# Patient Record
Sex: Male | Born: 1935 | Race: White | Hispanic: No | Marital: Married | Smoking: Former smoker
Health system: Southern US, Community
[De-identification: ages and names within clinical notes are randomized; demographics above are authoritative.]

## PROBLEM LIST (undated history)

## (undated) DIAGNOSIS — K222 Esophageal obstruction: Secondary | ICD-10-CM

## (undated) DIAGNOSIS — I739 Peripheral vascular disease, unspecified: Secondary | ICD-10-CM

## (undated) DIAGNOSIS — I10 Essential (primary) hypertension: Secondary | ICD-10-CM

## (undated) DIAGNOSIS — M1380 Other specified arthritis, unspecified site: Secondary | ICD-10-CM

## (undated) DIAGNOSIS — G4733 Obstructive sleep apnea (adult) (pediatric): Secondary | ICD-10-CM

## (undated) DIAGNOSIS — Z955 Presence of coronary angioplasty implant and graft: Secondary | ICD-10-CM

## (undated) DIAGNOSIS — K760 Fatty (change of) liver, not elsewhere classified: Secondary | ICD-10-CM

## (undated) DIAGNOSIS — E785 Hyperlipidemia, unspecified: Secondary | ICD-10-CM

## (undated) DIAGNOSIS — I2510 Atherosclerotic heart disease of native coronary artery without angina pectoris: Secondary | ICD-10-CM

## (undated) HISTORY — PX: COLONOSCOPY: SHX174

## (undated) HISTORY — PX: CARDIAC CATHETERIZATION: SHX172

---

## 2007-03-19 ENCOUNTER — Ambulatory Visit: Payer: Self-pay | Admitting: Unknown Physician Specialty

## 2010-01-20 ENCOUNTER — Ambulatory Visit: Payer: Self-pay | Admitting: Ophthalmology

## 2012-01-06 ENCOUNTER — Other Ambulatory Visit: Payer: Self-pay | Admitting: Neurosurgery

## 2012-01-06 DIAGNOSIS — S32009A Unspecified fracture of unspecified lumbar vertebra, initial encounter for closed fracture: Secondary | ICD-10-CM

## 2012-01-06 DIAGNOSIS — M545 Low back pain: Secondary | ICD-10-CM

## 2012-01-09 ENCOUNTER — Other Ambulatory Visit: Payer: Self-pay | Admitting: Neurosurgery

## 2012-01-09 DIAGNOSIS — M545 Low back pain: Secondary | ICD-10-CM

## 2012-01-09 DIAGNOSIS — S32009A Unspecified fracture of unspecified lumbar vertebra, initial encounter for closed fracture: Secondary | ICD-10-CM

## 2012-01-11 ENCOUNTER — Other Ambulatory Visit: Payer: Self-pay

## 2012-01-12 ENCOUNTER — Ambulatory Visit
Admission: RE | Admit: 2012-01-12 | Discharge: 2012-01-12 | Disposition: A | Discharge status: Home / Self Care | Payer: PRIVATE HEALTH INSURANCE | Source: Ambulatory Visit | Attending: Neurosurgery | Admitting: Neurosurgery

## 2012-01-12 DIAGNOSIS — M545 Low back pain: Secondary | ICD-10-CM

## 2012-01-12 DIAGNOSIS — S32009A Unspecified fracture of unspecified lumbar vertebra, initial encounter for closed fracture: Secondary | ICD-10-CM

## 2012-03-14 ENCOUNTER — Ambulatory Visit: Payer: Self-pay | Admitting: Unknown Physician Specialty

## 2012-10-09 ENCOUNTER — Ambulatory Visit: Payer: Self-pay | Admitting: Unknown Physician Specialty

## 2012-10-10 LAB — PATHOLOGY REPORT

## 2014-12-16 ENCOUNTER — Emergency Department
Admit: 2014-12-16 | Disposition: A | Discharge status: Home / Self Care | Payer: Self-pay | Admitting: Emergency Medicine

## 2014-12-16 LAB — TROPONIN I: Troponin-I: <0.03 ng/mL

## 2014-12-16 LAB — BASIC METABOLIC PANEL
Anion Gap: 7 (ref 7–16)
BUN: 20 mg/dL
CALCIUM: 8.8 mg/dL — AB
CHLORIDE: 104 mmol/L
CREATININE: 1.03 mg/dL
Co2: 25 mmol/L
EGFR (African American): >60
EGFR (Non-African Amer.): >60
GLUCOSE: 101 mg/dL — AB
POTASSIUM: 4.3 mmol/L
SODIUM: 136 mmol/L

## 2014-12-16 LAB — CBC
HCT: 43.4 % (ref 40.0–52.0)
HGB: 14.5 g/dL (ref 13.0–18.0)
MCH: 31.7 pg (ref 26.0–34.0)
MCHC: 33.3 g/dL (ref 32.0–36.0)
MCV: 95 fL (ref 80–100)
Platelet: 237 10*3/uL (ref 150–440)
RBC: 4.57 10*6/uL (ref 4.40–5.90)
RDW: 13.2 % (ref 11.5–14.5)
WBC: 6.9 10*3/uL (ref 3.8–10.6)

## 2014-12-19 ENCOUNTER — Ambulatory Visit
Admit: 2014-12-19 | Disposition: A | Discharge status: Home / Self Care | Payer: Self-pay | Admitting: Obstetrics and Gynecology

## 2014-12-19 ENCOUNTER — Ambulatory Visit
Admit: 2014-12-19 | Disposition: A | Discharge status: Home / Self Care | Payer: Self-pay | Attending: Internal Medicine | Admitting: Internal Medicine

## 2014-12-25 ENCOUNTER — Other Ambulatory Visit (HOSPITAL_COMMUNITY): Payer: Self-pay | Admitting: Internal Medicine

## 2014-12-25 DIAGNOSIS — R079 Chest pain, unspecified: Secondary | ICD-10-CM

## 2015-01-07 ENCOUNTER — Ambulatory Visit (HOSPITAL_COMMUNITY): Payer: Medicare Other

## 2015-01-08 ENCOUNTER — Ambulatory Visit
Admit: 2015-01-08 | Disposition: A | Discharge status: Other Acute Inpatient Hospital | Payer: Self-pay | Attending: Internal Medicine | Admitting: Internal Medicine

## 2015-01-08 LAB — BASIC METABOLIC PANEL
Anion Gap: 5 — ABNORMAL LOW (ref 7–16)
BUN: 22 mg/dL — ABNORMAL HIGH
CALCIUM: 8.3 mg/dL — AB
CHLORIDE: 108 mmol/L
CREATININE: 1.03 mg/dL
Co2: 24 mmol/L
EGFR (African American): >60
Glucose: 140 mg/dL — ABNORMAL HIGH
POTASSIUM: 4.1 mmol/L
Sodium: 137 mmol/L

## 2015-01-08 LAB — CBC WITH DIFFERENTIAL/PLATELET
BASOS ABS: 0.1 10*3/uL (ref 0.0–0.1)
Basophil %: 0.5 %
EOS ABS: 0 10*3/uL (ref 0.0–0.7)
Eosinophil %: 0.2 %
HCT: 40.7 % (ref 40.0–52.0)
HGB: 13.4 g/dL (ref 13.0–18.0)
LYMPHS ABS: 1 10*3/uL (ref 1.0–3.6)
Lymphocyte %: 7.3 %
MCH: 31.6 pg (ref 26.0–34.0)
MCHC: 32.9 g/dL (ref 32.0–36.0)
MCV: 96 fL (ref 80–100)
MONOS PCT: 5.4 %
Monocyte #: 0.7 x10 3/mm (ref 0.2–1.0)
Neutrophil #: 11.7 10*3/uL — ABNORMAL HIGH (ref 1.4–6.5)
Neutrophil %: 86.6 %
PLATELETS: 262 10*3/uL (ref 150–440)
RBC: 4.23 10*6/uL — ABNORMAL LOW (ref 4.40–5.90)
RDW: 13.2 % (ref 11.5–14.5)
WBC: 13.5 10*3/uL — AB (ref 3.8–10.6)

## 2015-01-08 LAB — CK TOTAL AND CKMB (NOT AT ARMC)
CK, TOTAL: 81 U/L
CK-MB: 1.8 ng/mL

## 2015-01-09 ENCOUNTER — Inpatient Hospital Stay (HOSPITAL_COMMUNITY): Payer: Medicare Other

## 2015-01-09 ENCOUNTER — Encounter (HOSPITAL_COMMUNITY): Payer: Self-pay | Admitting: Internal Medicine

## 2015-01-09 ENCOUNTER — Inpatient Hospital Stay (HOSPITAL_COMMUNITY)
Admission: AD | Admit: 2015-01-09 | Discharge: 2015-01-14 | DRG: 065 | Disposition: A | Discharge status: Rehabilitation Facility | Payer: Medicare Other | Source: Other Acute Inpatient Hospital | Attending: Internal Medicine | Admitting: Internal Medicine

## 2015-01-09 DIAGNOSIS — K222 Esophageal obstruction: Secondary | ICD-10-CM | POA: Diagnosis present

## 2015-01-09 DIAGNOSIS — G4733 Obstructive sleep apnea (adult) (pediatric): Secondary | ICD-10-CM | POA: Diagnosis present

## 2015-01-09 DIAGNOSIS — Z86718 Personal history of other venous thrombosis and embolism: Secondary | ICD-10-CM | POA: Diagnosis not present

## 2015-01-09 DIAGNOSIS — R21 Rash and other nonspecific skin eruption: Secondary | ICD-10-CM | POA: Diagnosis present

## 2015-01-09 DIAGNOSIS — Z79899 Other long term (current) drug therapy: Secondary | ICD-10-CM | POA: Diagnosis not present

## 2015-01-09 DIAGNOSIS — M064 Inflammatory polyarthropathy: Secondary | ICD-10-CM | POA: Diagnosis present

## 2015-01-09 DIAGNOSIS — Z4659 Encounter for fitting and adjustment of other gastrointestinal appliance and device: Secondary | ICD-10-CM | POA: Insufficient documentation

## 2015-01-09 DIAGNOSIS — E785 Hyperlipidemia, unspecified: Secondary | ICD-10-CM | POA: Diagnosis present

## 2015-01-09 DIAGNOSIS — I6310 Cerebral infarction due to embolism of unspecified precerebral artery: Secondary | ICD-10-CM | POA: Diagnosis not present

## 2015-01-09 DIAGNOSIS — R471 Dysarthria and anarthria: Secondary | ICD-10-CM | POA: Diagnosis present

## 2015-01-09 DIAGNOSIS — N400 Enlarged prostate without lower urinary tract symptoms: Secondary | ICD-10-CM | POA: Diagnosis present

## 2015-01-09 DIAGNOSIS — I69391 Dysphagia following cerebral infarction: Secondary | ICD-10-CM | POA: Insufficient documentation

## 2015-01-09 DIAGNOSIS — I10 Essential (primary) hypertension: Secondary | ICD-10-CM | POA: Diagnosis present

## 2015-01-09 DIAGNOSIS — I2510 Atherosclerotic heart disease of native coronary artery without angina pectoris: Secondary | ICD-10-CM

## 2015-01-09 DIAGNOSIS — Z823 Family history of stroke: Secondary | ICD-10-CM | POA: Diagnosis not present

## 2015-01-09 DIAGNOSIS — Z87891 Personal history of nicotine dependence: Secondary | ICD-10-CM

## 2015-01-09 DIAGNOSIS — Z7902 Long term (current) use of antithrombotics/antiplatelets: Secondary | ICD-10-CM | POA: Diagnosis not present

## 2015-01-09 DIAGNOSIS — Z7982 Long term (current) use of aspirin: Secondary | ICD-10-CM

## 2015-01-09 DIAGNOSIS — I69322 Dysarthria following cerebral infarction: Secondary | ICD-10-CM | POA: Diagnosis not present

## 2015-01-09 DIAGNOSIS — I82491 Acute embolism and thrombosis of other specified deep vein of right lower extremity: Secondary | ICD-10-CM | POA: Diagnosis not present

## 2015-01-09 DIAGNOSIS — G8191 Hemiplegia, unspecified affecting right dominant side: Secondary | ICD-10-CM | POA: Diagnosis present

## 2015-01-09 DIAGNOSIS — R1310 Dysphagia, unspecified: Secondary | ICD-10-CM | POA: Diagnosis present

## 2015-01-09 DIAGNOSIS — R4701 Aphasia: Secondary | ICD-10-CM | POA: Diagnosis present

## 2015-01-09 DIAGNOSIS — I6501 Occlusion and stenosis of right vertebral artery: Secondary | ICD-10-CM

## 2015-01-09 DIAGNOSIS — Z955 Presence of coronary angioplasty implant and graft: Secondary | ICD-10-CM | POA: Diagnosis not present

## 2015-01-09 DIAGNOSIS — I6340 Cerebral infarction due to embolism of unspecified cerebral artery: Secondary | ICD-10-CM | POA: Diagnosis not present

## 2015-01-09 DIAGNOSIS — I639 Cerebral infarction, unspecified: Secondary | ICD-10-CM

## 2015-01-09 DIAGNOSIS — I69359 Hemiplegia and hemiparesis following cerebral infarction affecting unspecified side: Secondary | ICD-10-CM | POA: Diagnosis present

## 2015-01-09 DIAGNOSIS — K760 Fatty (change of) liver, not elsewhere classified: Secondary | ICD-10-CM | POA: Diagnosis present

## 2015-01-09 DIAGNOSIS — R579 Shock, unspecified: Secondary | ICD-10-CM | POA: Diagnosis not present

## 2015-01-09 DIAGNOSIS — R270 Ataxia, unspecified: Secondary | ICD-10-CM | POA: Diagnosis present

## 2015-01-09 DIAGNOSIS — I6789 Other cerebrovascular disease: Secondary | ICD-10-CM | POA: Diagnosis not present

## 2015-01-09 DIAGNOSIS — I63012 Cerebral infarction due to thrombosis of left vertebral artery: Secondary | ICD-10-CM | POA: Diagnosis not present

## 2015-01-09 DIAGNOSIS — I97610 Postprocedural hemorrhage and hematoma of a circulatory system organ or structure following a cardiac catheterization: Secondary | ICD-10-CM | POA: Diagnosis not present

## 2015-01-09 DIAGNOSIS — I6319 Cerebral infarction due to embolism of other precerebral artery: Secondary | ICD-10-CM | POA: Diagnosis not present

## 2015-01-09 HISTORY — DX: Esophageal obstruction: K22.2

## 2015-01-09 HISTORY — DX: Hyperlipidemia, unspecified: E78.5

## 2015-01-09 HISTORY — DX: Presence of coronary angioplasty implant and graft: Z95.5

## 2015-01-09 HISTORY — DX: Essential (primary) hypertension: I10

## 2015-01-09 HISTORY — DX: Obstructive sleep apnea (adult) (pediatric): G47.33

## 2015-01-09 HISTORY — DX: Atherosclerotic heart disease of native coronary artery without angina pectoris: I25.10

## 2015-01-09 HISTORY — DX: Other specified arthritis, unspecified site: M13.80

## 2015-01-09 HISTORY — DX: Fatty (change of) liver, not elsewhere classified: K76.0

## 2015-01-09 LAB — BASIC METABOLIC PANEL
ANION GAP: 6 — AB (ref 7–16)
BUN: 22 mg/dL — ABNORMAL HIGH
CREATININE: 0.96 mg/dL
Calcium, Total: 8.5 mg/dL — ABNORMAL LOW
Chloride: 112 mmol/L — ABNORMAL HIGH
Co2: 24 mmol/L
Glucose: 107 mg/dL — ABNORMAL HIGH
Potassium: 4 mmol/L
Sodium: 142 mmol/L

## 2015-01-09 LAB — CBC WITH DIFFERENTIAL/PLATELET
Basophils Absolute: 0 10*3/uL (ref 0.0–0.1)
Basophils Relative: 0 % (ref 0–1)
Eosinophils Absolute: 0.1 10*3/uL (ref 0.0–0.7)
Eosinophils Relative: 1 % (ref 0–5)
HEMATOCRIT: 33 % — AB (ref 39.0–52.0)
Hemoglobin: 11.5 g/dL — ABNORMAL LOW (ref 13.0–17.0)
LYMPHS ABS: 1.6 10*3/uL (ref 0.7–4.0)
Lymphocytes Relative: 18 % (ref 12–46)
MCH: 32.4 pg (ref 26.0–34.0)
MCHC: 34.8 g/dL (ref 30.0–36.0)
MCV: 93 fL (ref 78.0–100.0)
MONOS PCT: 9 % (ref 3–12)
Monocytes Absolute: 0.8 10*3/uL (ref 0.1–1.0)
NEUTROS ABS: 6.1 10*3/uL (ref 1.7–7.7)
NEUTROS PCT: 72 % (ref 43–77)
Platelets: 209 10*3/uL (ref 150–400)
RBC: 3.55 MIL/uL — ABNORMAL LOW (ref 4.22–5.81)
RDW: 13.2 % (ref 11.5–15.5)
WBC: 8.5 10*3/uL (ref 4.0–10.5)

## 2015-01-09 MED ORDER — ASPIRIN 325 MG PO TABS
325.0000 mg | ORAL_TABLET | Freq: Every day | ORAL | Status: DC
  Administered 2015-01-10 – 2015-01-14 (×4): 325 mg via ORAL
  Filled 2015-01-09 (×4): qty 1

## 2015-01-09 MED ORDER — CLOPIDOGREL BISULFATE 75 MG PO TABS
75.0000 mg | ORAL_TABLET | Freq: Every day | ORAL | Status: DC
  Administered 2015-01-10 – 2015-01-14 (×6): 75 mg via NASOGASTRIC
  Filled 2015-01-09 (×6): qty 1

## 2015-01-09 MED ORDER — SALINE SPRAY 0.65 % NA SOLN
1.0000 | NASAL | Status: DC | PRN
  Filled 2015-01-09: qty 44

## 2015-01-09 MED ORDER — CLOPIDOGREL BISULFATE 75 MG PO TABS: 75.0000 mg | ORAL_TABLET | Freq: Every day | ORAL | Status: DC

## 2015-01-09 MED ORDER — ASPIRIN 300 MG RE SUPP
300.0000 mg | Freq: Every day | RECTAL | Status: DC
  Administered 2015-01-09 – 2015-01-12 (×2): 300 mg via RECTAL
  Filled 2015-01-09 (×2): qty 1

## 2015-01-09 MED ORDER — HEPARIN SODIUM (PORCINE) 5000 UNIT/ML IJ SOLN
5000.0000 [IU] | Freq: Three times a day (TID) | INTRAMUSCULAR | Status: DC
  Administered 2015-01-09 – 2015-01-14 (×15): 5000 [IU] via SUBCUTANEOUS
  Filled 2015-01-09 (×15): qty 1

## 2015-01-09 MED ORDER — STROKE: EARLY STAGES OF RECOVERY BOOK
Freq: Once | Status: AC
  Administered 2015-01-09: 1

## 2015-01-09 NOTE — Progress Notes (Signed)
RN informed that patient arrived before shift change. No current orders, will page MD for orders. Patient comfortable and family at bedside. Will continue to monitor closely. Monia PouchShakenna Quillan Whitter, RN

## 2015-01-09 NOTE — Consult Note (Signed)
Neurology Consultation Reason for Consult: Stroke Referring Physician:   CC: Speaking difficulty  History is obtained from:patient, medical record  HPI: Jerome Bowen is a 81 y.o. male who underwent elective cardiac cath for chest pain with abnormal strss and had a distal RCA stenosis and had a DES placed. Following the procedure, he was noted to be agitated. He was given further sedation with suspicion for possible adverse reaction to his initial sedation. He was noted to have difficulty speaking and as he was less agitated, it became clear that the aphasia persisted. An MRI was obtained which shows multifocal infarcts in both the anterior and posterior circulation consistent with cardiac embolus.    He also developed post-operative hematoma.   LKW: prior to MRI 4/28.  tpa given?: no,    ROS: A 14 point ROS was performed and is negative except as noted in the HPI.   PMH: CAD HTN H/o DVT Fatty liver Spinal stenosis bph seronegative Inflammatory arthritis esophageal stricture  Family History: Brother - stroke  Social History: Tob: denies  Exam: Current vital signs: BP 146/61 mmHg  Pulse 69  Temp(Src) 98.1 F (36.7 C) (Oral)  Resp 18  SpO2 98% Vital signs in last 24 hours: Temp:  [98.1 F (36.7 C)] 98.1 F (36.7 C) (04/29 1857) Pulse Rate:  [69] 69 (04/29 1857) Resp:  [18] 18 (04/29 1857) BP: (146)/(61) 146/61 mmHg (04/29 1857) SpO2:  [98 %] 98 % (04/29 1857) Weight:  [81.602 kg (179 lb 14.4 oz)] 81.602 kg (179 lb 14.4 oz) (04/29 1331)   Physical Exam  Constitutional: Appears well-developed and well-nourished.  Psych: Affect appropriate to situation Eyes: No scleral injection HENT: No OP obstrucion Head: Normocephalic.  Cardiovascular: Normal rate and regular rhythm.  Respiratory: Effort normal and breath sounds normal to anterior ascultation GI: Soft.  No distension. There is no tenderness.  Skin: WDI  Neuro: Mental Status: Patient is awake, alert,  awake, interactive and appropriate Patient is able to contribute details to the history.  No signs of neglect. Aphasia is difficult to assess given difficulty with writing due to ataxia and severe dysarthria. I suspect that he does not have any.  Cranial Nerves: II: Visual Fields are full. Pupils are equal, round, and reactive to light.   III,IV, VI: He is unable to cross midlien to the left, his right eye is abel to abduct but his left eye does not follow. He is able to look up and down with both eyes.  V: Facial sensation is symmetric to temperature VII: Facial movement is symmetric.  VIII: hearing is intact to voice Motor: Tone is normal. Bulk is normal. 5/5 strength was present on the left, he has 4/5 strength in the right arm and leg.  Sensory: Sensation is decreased to temp in the right arm, symmetric in the leg.  Deep Tendon Reflexes: 2+ and symmetric in the biceps and patellae.  Cerebellar: FNF and HKS are intact on the left, ataxic on the right.    I have reviewed labs in epic and the results pertinent to this consultation are: Cr(prior to procedure) 1.0 Recent LDL 109  I have reviewed the images obtained:MRI brain multifocal posterior circulation infarcts.   Impression: 81 yo M with multiple posterior circulation infarcts. I suspect that he had a single embolus that broke up and resulted in the shower. This is likely from the catheterization. I would image his posterior circulation, but if no clear source lesion is found, then I do not think further  workup would be needed and this would be considered a catheter related stroke.   Recommendations: 1) MRA brain and neck 2) He will likely be controlled for atherosclerotic risk factors for his cardiological reasons. 3) Can continue ASA + Plavix for DES from neuro perspective.  4) PT, OT, ST. Strict NPO pending ST eval\ 5) Stroke team to follow.     Ritta SlotMcNeill Lovena Kluck, MD [facility] [phone]  If 7pm- 7am,  please page neurology on call as listed in AMION.

## 2015-01-09 NOTE — H&P (Signed)
[facility] History and Physical  Patient: Jerome Bowen  MRN: [id number]  DOB: [birth year]  DOS: the patient was seen and examined on 01/09/2015 PCP: No PCP Per Patient  [facility] Chief Complaint: Stroke  HPI: Jerome Bowen is a 81 y.o. male with Past medical history of hypertension, dyslipidemia, coronary artery disease, obstructive sleep apnea on C Pap, seronegative arthritis. The patient was [facility]. The patient was recently hospitalized for elective angiography after an abnormal stress test. The patient was having progressively worsening chest pain prior to the stress test. The angiography showed that the patient had an RCA obstruction and patient underwent PCI with drug-eluting stent. During the procedure the patient become agitated and was acting confused. Initially it was thought that the patient had a reaction to Versed. Patient also developed a small hematoma at the groin. Patient was placed on tirofiban overnight per protocol. A CT of the head initially did not reveal any acute abnormality. Due to persistent aphasia and confusion and MRI was up pain which was positive for multifocal strokes. Carotid Doppler as well as echo program was obtained and the patient was sent here for further workup.  The patient is coming from hospital. And at his baseline independent for most of his ADL.  Review of Systems: as mentioned in the history of present illness.  A comprehensive review of the other systems is negative.  Past Medical History  Diagnosis Date  . Essential hypertension   . Dyslipidemia   . CAD (coronary artery disease)   . S/P right coronary artery (RCA) stent placement   . OSA (obstructive sleep apnea)   . Esophageal stricture   . Seronegative arthritis   . Fatty liver    No past surgical history on file. Social History:  reports that he has quit smoking. His smoking use included Cigarettes. He does not have any smokeless  tobacco history on file. He reports that he drinks alcohol. His drug history is not on file.  No Known Allergies  Family History  Problem Relation Age of Onset  . Heart disease Brother     Prior to Admission medications   Medication Sig Start Date End Date Taking? Authorizing Provider  aspirin EC 325 MG tablet Take 325 mg by mouth daily.   Yes Historical Provider, MD  cholecalciferol (VITAMIN D) 1000 UNITS tablet Take 1,000 Units by mouth daily.   Yes Historical Provider, MD  clopidogrel (PLAVIX) 75 MG tablet Take 75 mg by mouth daily.   Yes Historical Provider, MD  Cyanocobalamin 1000 MCG TBCR Take 1 tablet by mouth daily.   Yes Historical Provider, MD  losartan (COZAAR) 100 MG tablet Take 100 mg by mouth daily.   Yes Historical Provider, MD  tamsulosin (FLOMAX) 0.4 MG CAPS capsule Take 0.4 mg by mouth daily.    Yes Historical Provider, MD  vitamin E 1000 UNIT capsule Take 1,000 Units by mouth daily.   Yes Historical Provider, MD    Physical Exam: Filed Vitals:   01/09/15 1857  BP: 146/61  Pulse: 69  Temp: 98.1 F (36.7 C)  TempSrc: Oral  Resp: 18  SpO2: 98%    General: Alert, Awake and Oriented Appear in mild distress Eyes: PERRL ENT: Oral Mucosa clear dry Neck: no JVD Cardiovascular: S1 and S2 Present, no Murmur, Peripheral Pulses Present Respiratory: Bilateral Air entry equal and Decreased,  Clear to Auscultation, no Crackles, no wheezes Abdomen: Bowel Sound present, Soft and non tender Skin: no Rash Extremities:  no Pedal edema, no calf tenderness The groin site does not appear to have any hematoma has extensive erythema Neurologic: Bilateral equal strength in the lower extremity. 4 out of 5 in upper extremity. Expressive aphasia.  Labs on Admission:  CBC:  Recent Labs Lab 01/09/15 2304  WBC 8.5  NEUTROABS 6.1  HGB 11.5*  HCT 33.0*  MCV 93.0  PLT 209    CMP     Component Value Date/Time   NA 142 01/09/2015 2304   K 3.8 01/09/2015 2304   CL 110  01/09/2015 2304   CO2 23 01/09/2015 2304   GLUCOSE 101* 01/09/2015 2304   BUN 19 01/09/2015 2304   CREATININE 0.96 01/09/2015 2304   CALCIUM 8.5 01/09/2015 2304   PROT 6.0 01/09/2015 2304   ALBUMIN 3.0* 01/09/2015 2304   AST 21 01/09/2015 2304   ALT 20 01/09/2015 2304   ALKPHOS 45 01/09/2015 2304   BILITOT 0.8 01/09/2015 2304   GFRNONAA 77* 01/09/2015 2304   GFRAA 89* 01/09/2015 2304    Radiological Exams on Admission: Ct Abdomen Pelvis Wo Contrast  01/08/2015   CLINICAL DATA:  Status post cardiac catheterization. Swelling in the right lower quadrant. Possible hematoma.  EXAM: CT ABDOMEN AND PELVIS WITHOUT CONTRAST  TECHNIQUE: Multidetector CT imaging of the abdomen and pelvis was performed following the standard protocol without IV contrast.  COMPARISON:  None.  FINDINGS: The lung bases appear emphysematous but are clear. No pleural or pericardial effusion is identified.  Contrast material from the patient's cardiac catheterization is seen in the kidneys and urinary bladder. Urinary bladder contrast creates streak artifact in the pelvis. Hemorrhage is seen in the right groin which is not confluent and measures approximately 5.7 cm AP x 8.7 cm transverse by at least 10 cm craniocaudal. Hematoma is also identified in the right pelvis measuring approximately 5.1 cm AP by 3.6 cm transverse by 7.2 cm craniocaudal.  The gallbladder, spleen, adrenal glands, pancreas and kidneys are unremarkable. A tiny low attenuating lesion in the left hepatic lobe anteriorly and near the dome is likely a cyst. The liver is otherwise unremarkable. No lymphadenopathy is seen. Scattered aortoiliac atherosclerosis without aneurysm is noted. The stomach small bowel and appendix appear normal.  There is a superior endplate compression fracture of L2 with vertebral body height loss of approximately 40%. The fracture appears remote but cannot be definitively characterized. No lytic or sclerotic bony lesion is seen.   IMPRESSION: The study is positive for a moderate sized hematoma in the right groin extending into the right pelvis as described above. No other acute abnormality is identified.  Aortoiliac atherosclerosis without aneurysm.  L2 superior endplate compression fracture appears remote but cannot be definitively characterized.   Electronically Signed   By: Drusilla Kanner M.D.   On: 01/08/2015 10:38   US Carotid Bilateral  01/09/2015   CLINICAL DATA:  History of stroke, hypertension, CAD, visual disturbance, hyperlipidemia.  EXAM: BILATERAL CAROTID DUPLEX ULTRASOUND  TECHNIQUE: Wallace Cullens scale imaging, color Doppler and duplex ultrasound were performed of bilateral carotid and vertebral arteries in the neck.  COMPARISON:  Head CT- 01/08/2015; 12/19/2014  FINDINGS: Criteria: Quantification of carotid stenosis is based on velocity parameters that correlate the residual internal carotid diameter with NASCET-based stenosis levels, using the diameter of the distal internal carotid lumen as the denominator for stenosis measurement.  The following velocity measurements were obtained:  RIGHT  ICA:  104/15 cm/sec  CCA:  133/18 cm/sec  SYSTOLIC ICA/CCA RATIO:  0.8  DIASTOLIC ICA/CCA RATIO:  0.9  ECA:  95 cm/sec  LEFT  ICA:  115/21 cm/sec  CCA:  134/17 cm/sec  SYSTOLIC ICA/CCA RATIO:  0.9  DIASTOLIC ICA/CCA RATIO:  1.3  ECA:  115 cm/sec  RIGHT CAROTID ARTERY: There is a minimal amount of eccentric mixed echogenic plaque within the right carotid bulb (image 12), extending to involve the origin and proximal aspects of the right internal carotid artery (image 18), not resulting in elevated peak systolic velocities within the interrogated course of the right internal carotid artery to suggest a hemodynamically significant stenosis.  RIGHT VERTEBRAL ARTERY:  Antegrade flow  LEFT CAROTID ARTERY: There is a very minimal amount of intimal wall thickening involving the origin and proximal aspects of the left internal carotid artery (image  49), not resulting in elevated peak systolic velocities within the interrogated course of the left internal carotid artery to suggest a hemodynamically significant stenosis.  LEFT VERTEBRAL ARTERY:  Antegrade flow  IMPRESSION: Very minimal amount of bilateral intimal wall thickening and atherosclerotic plaque, right subjectively greater than left, not resulting in a hemodynamically significant stenosis.   Electronically Signed   By: Simonne Come M.D.   On: 01/09/2015 10:43   Ct Head Limited W/o Cm  01/08/2015   CLINICAL DATA:  Cardiac catheterization today. Altered mental status.  EXAM: CT HEAD WITHOUT CONTRAST  TECHNIQUE: Contiguous axial images were obtained from the base of the skull through the vertex without intravenous contrast.  COMPARISON:  None.  FINDINGS: Vascular contrast is identified from preceding cardiac catheterization.  Mild atrophy, appropriate for age.  Negative for hydrocephalus.  Negative for acute or chronic ischemia. Negative for hemorrhage or mass  Normal enhancement.  Calvarium intact.  IMPRESSION: Age related atrophy.  No acute abnormality.  These results will be called to the ordering clinician or representative by the Radiologist Assistant, and communication documented in the PACS or zVision Dashboard.   Electronically Signed   By: Marlan Palau M.D.   On: 01/08/2015 10:53   Mr Brain Ltd W/o Cm  01/09/2015   CLINICAL DATA:  Expressive a aphasia following placement of cardiac stent 01/08/2015  EXAM: MRI HEAD WITHOUT CONTRAST  TECHNIQUE: Multiplanar, multiecho pulse sequences of the brain and surrounding structures were obtained without intravenous contrast.  COMPARISON:  CT head without contrast 01/08/2015. Carotid ultrasound 01/09/2015.  FINDINGS: Bilateral brainstem infarcts are present. The largest areas in the inferior pons right left paramedian infarct extends over 2 cm anterior to posterior. Additional punctate foci of restricted diffusion are present within the occipital lobes  bilaterally in the high left parietal lobe. There is a punctate area of focal infarct along the precentral sulcus on the right.  T2 changes are evident along the infarcts in the brainstem. Mild atrophy and periventricular white matter changes are chronic. No acute hemorrhage is evident.  Flow is present in the major intracranial arteries. There is a thick ram about a cystic lesion at floor of the left maxillary sinus. The collection measures 17 mm maximally and appears P associated with the alveolar ridge. The paranasal sinuses and mastoid air cells are otherwise clear. Skullbase is unremarkable. Midline structures are normal.  IMPRESSION: 1. Acute nonhemorrhagic bilateral infarcts of the brainstem. 2. Acute nonhemorrhagic bilateral occipital lobe infarcts. 3. Acute nonhemorrhagic punctate left parietal lobe infarct. 4. Acute nonhemorrhagic punctate posterior right frontal lobe infarct along the precentral gyrus. 5. Mild atrophy and white matter disease likely reflects the sequela of chronic microvascular ischemia in addition to the acute infarcts. 6. 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 These results were called by telephone at the time of interpretation on 01/09/2015 at 12:08 pm to Dr. Arnoldo Hooker, who verbally acknowledged these results.   Electronically Signed   By: Marin Roberts M.D.   On: 01/09/2015 12:09   EKG: Independently reviewed. normal sinus rhythm, nonspecific ST and T waves changes.  Assessment/Plan Principal Problem:   CVA (cerebral infarction) Active Problems:   Essential hypertension   Dyslipidemia   CAD (coronary artery disease)   S/P right coronary artery (RCA) stent placement   OSA (obstructive sleep apnea)   Esophageal stricture   1. CVA (cerebral infarction) The patient is presenting with complaints of stroke after cardiac catheterization. Patient was admitted  by neurology. Currently I would continue with suppository aspirin. Patient will require dual antiplatelet therapy due to his recent cardiac catheterization for which I would insert an NG tube and continue with Plavix. This was initially discussed with cardiology who recommended this plan. We will get MRA of brain and neck for further evaluation. Continue working with physical therapy.  2. Coronary artery disease and RCA drug-eluting stent. Groin hematoma. The groins and does not appear to have any hematoma at this point but does have erythema. Continue with aspirin and Plavix.  3. Dyslipidemia. Currently holding statin. Resume as and when can.  4. History of hypertension. Blood pressure currently stable currently closely monitoring and permissive hypertension.  5. History of sleep apnea. C Pap daily at bedtime  Advance goals of care discussion: Full code   Consults: I discussed with Dr. Amada Jupiter from neurolog.  DVT Prophylaxis: subcutaneous Heparin Nutrition: Nothing by mouth  Family Communication: family was present at bedside, opportunity was given to ask question and all questions were answered satisfactorily at the time of interview. Disposition: Admitted as inpatient, telemetry unit.  Author: Lynden Oxford, MD [facility] Pager: [phone] 01/09/2015  If 7PM-7AM, please contact night-coverage [email or web address] Password [email or web address]

## 2015-01-10 ENCOUNTER — Inpatient Hospital Stay (HOSPITAL_COMMUNITY): Payer: Medicare Other

## 2015-01-10 DIAGNOSIS — K222 Esophageal obstruction: Secondary | ICD-10-CM

## 2015-01-10 DIAGNOSIS — Z955 Presence of coronary angioplasty implant and graft: Secondary | ICD-10-CM

## 2015-01-10 DIAGNOSIS — I2510 Atherosclerotic heart disease of native coronary artery without angina pectoris: Secondary | ICD-10-CM | POA: Diagnosis present

## 2015-01-10 DIAGNOSIS — Z4659 Encounter for fitting and adjustment of other gastrointestinal appliance and device: Secondary | ICD-10-CM | POA: Insufficient documentation

## 2015-01-10 DIAGNOSIS — I10 Essential (primary) hypertension: Secondary | ICD-10-CM | POA: Diagnosis present

## 2015-01-10 DIAGNOSIS — E785 Hyperlipidemia, unspecified: Secondary | ICD-10-CM | POA: Diagnosis present

## 2015-01-10 DIAGNOSIS — G4733 Obstructive sleep apnea (adult) (pediatric): Secondary | ICD-10-CM | POA: Diagnosis present

## 2015-01-10 LAB — BASIC METABOLIC PANEL
ANION GAP: 8 (ref 5–15)
BUN: 18 mg/dL (ref 6–23)
CHLORIDE: 110 mmol/L (ref 96–112)
CO2: 22 mmol/L (ref 19–32)
Calcium: 8.7 mg/dL (ref 8.4–10.5)
Creatinine, Ser: 0.92 mg/dL (ref 0.50–1.35)
GFR calc Af Amer: >90 mL/min (ref >90)
GFR calc non Af Amer: 78 mL/min — ABNORMAL LOW (ref >90)
Glucose, Bld: 94 mg/dL (ref 70–99)
Potassium: 3.9 mmol/L (ref 3.5–5.1)
SODIUM: 140 mmol/L (ref 135–145)

## 2015-01-10 LAB — GLUCOSE, CAPILLARY
GLUCOSE-CAPILLARY: 93 mg/dL (ref 70–99)
GLUCOSE-CAPILLARY: 93 mg/dL (ref 70–99)
Glucose-Capillary: 106 mg/dL — ABNORMAL HIGH (ref 70–99)
Glucose-Capillary: 95 mg/dL (ref 70–99)
Glucose-Capillary: 99 mg/dL (ref 70–99)

## 2015-01-10 LAB — COMPREHENSIVE METABOLIC PANEL
ALK PHOS: 45 U/L (ref 39–117)
ALT: 20 U/L (ref 0–53)
AST: 21 U/L (ref 0–37)
Albumin: 3 g/dL — ABNORMAL LOW (ref 3.5–5.2)
Anion gap: 9 (ref 5–15)
BUN: 19 mg/dL (ref 6–23)
CO2: 23 mmol/L (ref 19–32)
Calcium: 8.5 mg/dL (ref 8.4–10.5)
Chloride: 110 mmol/L (ref 96–112)
Creatinine, Ser: 0.96 mg/dL (ref 0.50–1.35)
GFR calc Af Amer: 89 mL/min — ABNORMAL LOW (ref >90)
GFR calc non Af Amer: 77 mL/min — ABNORMAL LOW (ref >90)
Glucose, Bld: 101 mg/dL — ABNORMAL HIGH (ref 70–99)
POTASSIUM: 3.8 mmol/L (ref 3.5–5.1)
SODIUM: 142 mmol/L (ref 135–145)
Total Bilirubin: 0.8 mg/dL (ref 0.3–1.2)
Total Protein: 6 g/dL (ref 6.0–8.3)

## 2015-01-10 LAB — CBC
HEMATOCRIT: 35.6 % — AB (ref 39.0–52.0)
Hemoglobin: 12.3 g/dL — ABNORMAL LOW (ref 13.0–17.0)
MCH: 32.4 pg (ref 26.0–34.0)
MCHC: 34.6 g/dL (ref 30.0–36.0)
MCV: 93.7 fL (ref 78.0–100.0)
PLATELETS: 211 10*3/uL (ref 150–400)
RBC: 3.8 MIL/uL — ABNORMAL LOW (ref 4.22–5.81)
RDW: 13.2 % (ref 11.5–15.5)
WBC: 8 10*3/uL (ref 4.0–10.5)

## 2015-01-10 LAB — PROTIME-INR
INR: 1.18 (ref 0.00–1.49)
Prothrombin Time: 15.1 seconds (ref 11.6–15.2)

## 2015-01-10 LAB — APTT: APTT: 31 s (ref 24–37)

## 2015-01-10 LAB — LIPID PANEL
CHOLESTEROL: 149 mg/dL (ref 0–200)
HDL: 42 mg/dL (ref >39)
LDL Cholesterol: 93 mg/dL (ref 0–99)
Total CHOL/HDL Ratio: 3.5 RATIO
Triglycerides: 68 mg/dL (ref <150)
VLDL: 14 mg/dL (ref 0–40)

## 2015-01-10 MED ORDER — ATORVASTATIN CALCIUM 10 MG PO TABS
10.0000 mg | ORAL_TABLET | Freq: Every day | ORAL | Status: DC
  Administered 2015-01-10 – 2015-01-13 (×4): 10 mg
  Filled 2015-01-10 (×5): qty 1

## 2015-01-10 MED ORDER — SODIUM CHLORIDE 0.9 % IV SOLN
INTRAVENOUS | Status: DC
  Administered 2015-01-10 – 2015-01-13 (×3): via INTRAVENOUS

## 2015-01-10 MED ORDER — ACETAMINOPHEN 325 MG PO TABS: 650.0000 mg | ORAL_TABLET | ORAL | Status: DC | PRN

## 2015-01-10 MED ORDER — GADOBENATE DIMEGLUMINE 529 MG/ML IV SOLN
20.0000 mL | Freq: Once | INTRAVENOUS | Status: AC | PRN
  Administered 2015-01-10: 20 mL via INTRAVENOUS

## 2015-01-10 MED ORDER — FAMOTIDINE IN NACL 20-0.9 MG/50ML-% IV SOLN
20.0000 mg | Freq: Two times a day (BID) | INTRAVENOUS | Status: DC
  Administered 2015-01-10 – 2015-01-13 (×8): 20 mg via INTRAVENOUS
  Filled 2015-01-10 (×8): qty 50

## 2015-01-10 MED ORDER — CETYLPYRIDINIUM CHLORIDE 0.05 % MT LIQD
7.0000 mL | Freq: Two times a day (BID) | OROMUCOSAL | Status: DC
  Administered 2015-01-10 – 2015-01-14 (×8): 7 mL via OROMUCOSAL

## 2015-01-10 NOTE — Evaluation (Signed)
Speech Language Pathology Evaluation Patient Details Name: Jerome Bowen MRN: [id number] DOB: 1934/09/09 Today's Date: 01/10/2015 Time: [id number] SLP Time Calculation (min) (ACUTE ONLY): 15 min  Problem List:  Patient Active Problem List   Diagnosis Date Noted  . Essential hypertension   . Dyslipidemia   . CAD (coronary artery disease)   . S/P right coronary artery (RCA) stent placement   . OSA (obstructive sleep apnea)   . Esophageal stricture   . Encounter for nasogastric (NG) tube placement   . CVA (cerebral infarction) 01/09/2015   Past Medical History:  Past Medical History  Diagnosis Date  . Essential hypertension   . Dyslipidemia   . CAD (coronary artery disease)   . S/P right coronary artery (RCA) stent placement   . OSA (obstructive sleep apnea)   . Esophageal stricture   . Seronegative arthritis   . Fatty liver    Past Surgical History: No past surgical history on file. HPI:  Jerome DanasJack L Kenner is a 81 y.o. male with past medical history of hypertension, dyslipidemia, coronary artery disease, obstructive sleep apnea on C Pap, seronegative arthritis. The patient [facility].   Assessment / Plan / Recommendation Clinical Impression  Linguistic evaluation was completed.  The pt's basic receptive/expressive language skills appear to be intact.  The pt is severely dysarthric.  He attempts to verbally communicate however less then 25% is understood.  The pt had some success with intellgibility strategies (i.e. deep breath, over-articulate, speak loudly) improving intelligibility.  He is also successful using a typing board.  Writing to communicate is a bit more challenging due to decreased motor control.  The pt will benefit from F/U ST at this and the next level of care to address dysarthria.      SLP Assessment  Patient needs continued Speech Lanaguage Pathology Services    Follow Up Recommendations  Inpatient Rehab    Frequency and Duration min  2x/week  2 weeks      SLP Goals  Patient/Family Stated Goal: to eat/drink Potential to Achieve Goals (ACUTE ONLY): Good  SLP Evaluation Prior Functioning  Cognitive/Linguistic Baseline: Within functional limits Type of Home: House  Lives With: Spouse Available Help at Discharge: Family      Comprehension  Auditory Comprehension Overall Auditory Comprehension: Appears within functional limits for tasks assessed Yes/No Questions: Within Functional Limits Commands: Within Functional Limits Conversation: Other (comment) Other Conversation Comments: Pt severely dysarthric which is effecting his ability to effectively communicate.   EffectiveTechniques: Slowed Technical sales engineerspeech Visual Recognition/Discrimination Discrimination: Not tested Reading Comprehension Reading Status: Within funtional limits    Expression Expression Primary Mode of Expression: Augmentative device Verbal Expression Overall Verbal Expression: Impaired Initiation: Impaired Automatic Speech: Name Level of Generative/Spontaneous Verbalization: Word Repetition: No impairment Naming: No impairment (Pt needed to type out of alphabet board names of pictures.  ) Pragmatics: No impairment Interfering Components: Speech intelligibility Non-Verbal Means of Communication: Writing;Communication board Other Verbal Expression Comments: Basic verbal expression appears intact and is negatively impacted by his dysarthria.   Written Expression Dominant Hand: Right Written Expression: Within Functional Limits (except legibility is poor)   Oral / Motor Oral Motor/Sensory Function Overall Oral Motor/Sensory Function: Impaired Labial ROM: Reduced right Labial Symmetry: Abnormal symmetry right Labial Strength: Reduced Labial Sensation: Reduced Lingual ROM: Reduced left;Reduced right Lingual Symmetry: Abnormal symmetry right;Abnormal symmetry left Lingual Strength: Reduced Facial ROM: Reduced right Facial Symmetry: Right  droop Facial Strength: Reduced Mandible: Impaired Motor Speech Overall Motor Speech: Impaired Respiration: Within  functional limits Phonation: Wet;Hoarse Articulation: Impaired Level of Impairment: Word Intelligibility: Intelligibility reduced Word: 0-24% accurate Phrase: 0-24% accurate Sentence: 0-24% accurate Conversation: 0-24% accurate Motor Planning: Witnin functional limits Effective Techniques: Slow rate;Increased vocal intensity (deep breath prior to initiation of speech)   GO     Fleet Contras 01/10/2015, 1:07 PM  Dimas Aguas, MA, CCC-SLP Acute Rehab SLP [phone]

## 2015-01-10 NOTE — Evaluation (Signed)
Clinical/Bedside Swallow Evaluation Patient Details  Name: Jerome Bowen MRN: [id number] Date of Birth: 1934-08-30  Today's Date: 01/10/2015 Time: SLP Start Time (ACUTE ONLY): 1145 SLP Stop Time (ACUTE ONLY): 1203 SLP Time Calculation (min) (ACUTE ONLY): 18 min  Past Medical History:  Past Medical History  Diagnosis Date  . Essential hypertension   . Dyslipidemia   . CAD (coronary artery disease)   . S/P right coronary artery (RCA) stent placement   . OSA (obstructive sleep apnea)   . Esophageal stricture   . Seronegative arthritis   . Fatty liver    Past Surgical History: No past surgical history on file. HPI:  Jerome DanasJack L Specht is a 81 y.o. male with past medical history of hypertension, dyslipidemia, coronary artery disease, obstructive sleep apnea on C Pap, seronegative arthritis. The patient was brought in [facility].   Assessment / Plan / Recommendation Clinical Impression  Clinical evaluation of swallowing was completed.  The pt presents with oral and pharyngeal dysphagia c/b very prolonged oral phase, delayed swallow trigger, decrease hyo-laryngeal excursion with a wet vocal quality following swallow.  Given his clinical presentation and current admission rx keep the pt NPO pending the results of an MBS.      Aspiration Risk  Severe    Diet Recommendation NPO (pending MBS results)   Medication Administration: Via alternative means    Other  Recommendations Oral Care Recommendations: Oral care QID     Swallow Study    General Date of Onset: 01/09/15 Other Pertinent Information: Jerome DanasJack L Guillette is a 81 y.o. male with past medical history of hypertension, dyslipidemia, coronary artery disease, obstructive sleep apnea on C Pap, seronegative arthritis. The patient was brought in from [facility]. Type of Study: Bedside pediatric feeding/swallowing evaluation Previous Swallow Assessment: none noted Diet Prior to this Study: NPO;Panda Temperature Spikes Noted:  No Respiratory Status: Supplemental O2 delivered via (comment) History of Recent Intubation: No Behavior/Cognition: Alert;Cooperative;Pleasant mood Oral Cavity - Dentition: Adequate natural dentition/normal for age Patient Positioning: Upright in bed Baseline Vocal Quality: Wet;Hoarse Volitional Cough: Weak Volitional Swallow: Unable to elicit    Oral/Motor/Sensory Function Overall Oral Motor/Sensory Function: Impaired Labial ROM: Reduced right Labial Symmetry: Abnormal symmetry right Labial Strength: Reduced Labial Sensation: Reduced Lingual ROM: Reduced left;Reduced right Lingual Symmetry: Abnormal symmetry right;Abnormal symmetry left Lingual Strength: Reduced Facial ROM: Reduced right Facial Symmetry: Right droop Facial Strength: Reduced Mandible: Impaired   Ice Chips Ice chips: Impaired Presentation: Spoon Oral Phase Impairments: Impaired mastication;Impaired anterior to posterior transit;Reduced lingual movement/coordination Oral Phase Functional Implications: Prolonged oral transit Pharyngeal Phase Impairments: Suspected delayed Swallow;Decreased hyoid-laryngeal movement;Wet Vocal Quality   Thin Liquid Thin Liquid: Not tested    Nectar Thick Nectar Thick Liquid: Not tested   Honey Thick Honey Thick Liquid: Not tested   Puree Puree: Not tested   Solid   GO    Solid: Not tested       Dimas AguasGoodman, Rondel Episcopo N 01/10/2015,12:12 PM Dimas AguasMelissa Quientin Jent, MA, CCC-SLP Acute Rehab SLP [phone]

## 2015-01-10 NOTE — Progress Notes (Addendum)
OT NOTE  RN STAFF- Pt has a eye patch that is to be rotated R and L eye every 4 hours. Pt has baseline L eye deficits with only the ability to use the bottom two quadrants per wife. If pt is unable to tolerate R eye occlusion allow it to be rotated soon and leave progress note to alert OT staff ( please). Pt is to have patch on L eye with any mobility. Patch can be OFF when resting or supine in bed not completing a functional task.  Pt is able to write and point to communicate. A green chart paper and pen were left in room for patient to communicate with staff. SLP provided communication board in addition.   Pt demonstrates writing his name wifes name and location today 100% accuracy with R and L hand use during session.   Jerome Bowen, Brynn   OTR/L Pager: [phone] Office: [phone] .

## 2015-01-10 NOTE — Progress Notes (Signed)
Occupational Therapy Evaluation Patient Details Name: Jerome DanasJack L Fuqua MRN: [id number] DOB: 1934-03-14 Today's Date: 01/10/2015    History of Present Illness 81 yo male [facility] angiography undergoing PCI with drug eluting stent for RCA obstruction. 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 Pt currently with functional limitiations due to the deficits listed below (see OT problem list). PTA independent with all adls. Pt with baseline L eye deficits and now with diplopia. Pt cognitively [employer]FL during assessment. Pt able to write name location wifes name and answer yes / no appropriately. Pt attempting to verbalize but unable to clearly speak. Pt will benefit from skilled OT to increase their independence and safety with adls and balance to allow discharge CIR. Ot focused this session on diplopia as precursor to mobility and ADLs.     Follow Up Recommendations  CIR    Equipment Recommendations  Other (comment) (defer)    Recommendations for Other Services Rehab consult     Precautions / Restrictions Precautions Precautions: Fall;Other (comment) Precaution Comments: panda      Mobility Bed Mobility               General bed mobility comments: Pt supine on arrival and vision main reason for visit. OT provided patch and discussed patch purpose and schedule with pt / family .   Transfers                      Balance                                            ADL Overall ADL's : Needs assistance/impaired Eating/Feeding: NPO   Grooming: Wash/dry face;Oral care;Minimal assistance;Bed level Grooming Details (indicate cue type and reason): will need (A) to setup and avoid NG tubing Upper Body  Bathing: Moderate assistance;Bed level   Lower Body Bathing: Total assistance                         General ADL Comments: OT arriving in attempt to assess vision and help with overall mobility and SLP evaluation due to diplopia. pt provided eye patch     Vision Vision Assessment?: Yes Eye Alignment: Impaired (comment) Ocular Range of Motion: Impaired-to be further tested in functional context Tracking/Visual Pursuits: Requires cues, head turns, or add eye shifts to track;Unable to hold eye position out of midline;Impaired - to be further tested in functional context Convergence: Impaired (comment) Additional Comments: Due to expressive deficits - additional assessment will be provided each session OT visits. pt provided eye patch to be worn with glasses. Pt reports no diplopia with patch. pt with L eye baseline deficits from previous CVA in the eye per wife. Pt only able to use lower two quadrants on L eye. Pt is to patch L eye with mobilty to allow diplopia to be resolved for better return function. Pt reports immediate improvement with visual task.   Perception     Praxis      Pertinent Vitals/Pain Pain Assessment: No/denies pain     Hand Dominance Right   Extremity/Trunk Assessment Upper  Extremity Assessment Upper Extremity Assessment: RUE deficits/detail RUE Deficits / Details: decr fine motor and gross motor. Pt able to hold a pen and write name/ wife name . Pt able to alternate to the L hand and complete task in print with more legible text   Lower Extremity Assessment Lower Extremity Assessment: Defer to PT evaluation   Cervical / Trunk Assessment Cervical / Trunk Assessment: Normal   Communication Communication Communication: Expressive difficulties   Cognition Arousal/Alertness: Awake/alert Behavior During Therapy: WFL for tasks assessed/performed Overall Cognitive Status: Within Functional Limits for tasks assessed                     General  Comments       Exercises       Shoulder Instructions      Home Living Family/patient expects to be discharged to:: Private residence Living Arrangements: Spouse/significant other Available Help at Discharge: Family Type of Home: House                              Lives With: Spouse    Prior Functioning/Environment Level of Independence: Independent             OT Diagnosis: Generalized weakness   OT Problem List: Decreased strength;Decreased activity tolerance;Impaired balance (sitting and/or standing);Impaired vision/perception;Decreased safety awareness;Decreased knowledge of use of DME or AE;Decreased knowledge of precautions;Impaired UE functional use;Cardiopulmonary status limiting activity;Decreased coordination   OT Treatment/Interventions: Self-care/ADL training;Therapeutic exercise;Neuromuscular education;DME and/or AE instruction;Therapeutic activities;Patient/family education;Balance training;Visual/perceptual remediation/compensation    OT Goals(Current goals can be found in the care plan section) Acute Rehab OT Goals Patient Stated Goal: expressive deficits and no goal provided written at this time OT Goal Formulation: With patient/family Time For Goal Achievement: 01/24/15 Potential to Achieve Goals: Good  OT Frequency: Min 3X/week   Barriers to D/C:            Co-evaluation              End of Session Nurse Communication: Mobility status;Precautions  Activity Tolerance: Patient tolerated treatment well Patient left: in bed;with call bell/phone within reach;with family/visitor present   Time: 1214-1232 OT Time Calculation (min): 18 min Charges:  OT General Charges $OT Visit: 1 Procedure OT Evaluation $Initial OT Evaluation Tier I: 1 Procedure G-Codes:    Harolyn Rutherford 02/09/2015, 2:08 PM  Pager: [phone]

## 2015-01-10 NOTE — Plan of Care (Signed)
Problem: SLP Language Goals Goal: Patient will communicate needs/wants with Using communication board and/or writing

## 2015-01-10 NOTE — Progress Notes (Signed)
Stroke Team Progress Note  HISTORY 81 y.o. male who underwent elective cardiac cath for chest pain with abnormal stress and had a distal RCA stenosis and had a DES placed. Following the procedure, he was noted to be agitated. He was given further sedation with suspicion for possible adverse reaction to his initial sedation. He was noted to have difficulty speaking and as he was less agitated, it became clear that the aphasia persisted. An MRI was obtained which shows multifocal infarcts in both the anterior and posterior circulation consistent with cardiac embolus.     SUBJECTIVE Transferred from [place]. Obtain MRA H/N today.    OBJECTIVE Most recent Vital Signs: Filed Vitals:   01/09/15 2328 01/10/15 0121 01/10/15 0328 01/10/15 0545  BP: 127/64 127/67 145/71 143/78  Pulse: 64 56 72 77  Temp: 97.5 F (36.4 C) 98.1 F (36.7 C) 98.3 F (36.8 C) 98.2 F (36.8 C)  TempSrc: Oral Oral Oral Oral  Resp: SpO2: 95% 98% 96% 99%   CBG (last 3)   Recent Labs  01/10/15 0002 01/10/15 0617  GLUCAP 95 93    IV Fluid Intake:     MEDICATIONS  . aspirin  300 mg Rectal Daily   Or  . aspirin  325 mg Oral Daily  . clopidogrel  75 mg Per NG tube Daily  . famotidine (PEPCID) IV  20 mg Intravenous Q12H  . heparin  5,000 Units Subcutaneous 3 times per day   PRN:  acetaminophen, sodium chloride  Diet:  Diet NPO time specified  Activity:  Bedrest DVT Prophylaxis:  scd's heparin s/q  CLINICALLY SIGNIFICANT STUDIES Basic Metabolic Panel:  Recent Labs Lab 01/09/15 2304  NA 142  K 3.8  CL 110  CO2 23  GLUCOSE 101*  BUN 19  CREATININE 0.96  CALCIUM 8.5   Liver Function Tests:  Recent Labs Lab 01/09/15 2304  AST 21  ALT 20  ALKPHOS 45  BILITOT 0.8  PROT 6.0  ALBUMIN 3.0*   CBC:  Recent Labs Lab 01/09/15 2304 01/10/15 0744  WBC 8.5 8.0  NEUTROABS 6.1  --   HGB 11.5* 12.3*  HCT 33.0* 35.6*  MCV 93.0 93.7  PLT 209 211   Coagulation:  Recent Labs Lab  01/09/15 2304  LABPROT 15.1  INR 1.18   Cardiac Enzymes: No results for input(s): CKTOTAL, CKMB, CKMBINDEX, TROPONINI in the last 168 hours. Urinalysis: No results for input(s): COLORURINE, LABSPEC, PHURINE, GLUCOSEU, HGBUR, BILIRUBINUR, KETONESUR, PROTEINUR, UROBILINOGEN, NITRITE, LEUKOCYTESUR in the last 168 hours.  Invalid input(s): APPERANCEUR Lipid Panel    Component Value Date/Time   CHOL 149 01/09/2015 2304   TRIG 68 01/09/2015 2304   HDL 42 01/09/2015 2304   CHOLHDL 3.5 01/09/2015 2304   VLDL 14 01/09/2015 2304   LDLCALC 93 01/09/2015 2304   HgbA1C No results found for: HGBA1C  Urine Drug Screen:  No results found for: LABOPIA, COCAINSCRNUR, LABBENZ, AMPHETMU, THCU, LABBARB  Alcohol Level: No results for input(s): ETH in the last 168 hours.  Ct Abdomen Pelvis Wo Contrast  01/08/2015   CLINICAL DATA:  Status post cardiac catheterization. Swelling in the right lower quadrant. Possible hematoma.  EXAM: CT ABDOMEN AND PELVIS WITHOUT CONTRAST  TECHNIQUE: Multidetector CT imaging of the abdomen and pelvis was performed following the standard protocol without IV contrast.  COMPARISON:  None.  FINDINGS: The lung bases appear emphysematous but are clear. No pleural or pericardial effusion is identified.  Contrast material from the patient's cardiac catheterization is  seen in the kidneys and urinary bladder. Urinary bladder contrast creates streak artifact in the pelvis. Hemorrhage is seen in the right groin which is not confluent and measures approximately 5.7 cm AP x 8.7 cm transverse by at least 10 cm craniocaudal. Hematoma is also identified in the right pelvis measuring approximately 5.1 cm AP by 3.6 cm transverse by 7.2 cm craniocaudal.  The gallbladder, spleen, adrenal glands, pancreas and kidneys are unremarkable. A tiny low attenuating lesion in the left hepatic lobe anteriorly and near the dome is likely a cyst. The liver is otherwise unremarkable. No lymphadenopathy is seen.  Scattered aortoiliac atherosclerosis without aneurysm is noted. The stomach small bowel and appendix appear normal.  There is a superior endplate compression fracture of L2 with vertebral body height loss of approximately 40%. The fracture appears remote but cannot be definitively characterized. No lytic or sclerotic bony lesion is seen.  IMPRESSION: The study is positive for a moderate sized hematoma in the right groin extending into the right pelvis as described above. No other acute abnormality is identified.  Aortoiliac atherosclerosis without aneurysm.  L2 superior endplate compression fracture appears remote but cannot be definitively characterized.   Electronically Signed   By: Drusilla Kannerhomas  Dalessio M.D.   On: 01/08/2015 10:38   [place] Carotid Bilateral  01/09/2015   CLINICAL DATA:  History of stroke, hypertension, CAD, visual disturbance, hyperlipidemia.  EXAM: BILATERAL CAROTID DUPLEX ULTRASOUND  TECHNIQUE: Wallace CullensGray scale imaging, color Doppler and duplex ultrasound were performed of bilateral carotid and vertebral arteries in the neck.  COMPARISON:  Head CT- 01/08/2015; 12/19/2014  FINDINGS: Criteria: Quantification of carotid stenosis is based on velocity parameters that correlate the residual internal carotid diameter with NASCET-based stenosis levels, using the diameter of the distal internal carotid lumen as the denominator for stenosis measurement.  The following velocity measurements were obtained:  RIGHT  ICA:  104/15 cm/sec  CCA:  133/18 cm/sec  SYSTOLIC ICA/CCA RATIO:  0.8  DIASTOLIC ICA/CCA RATIO:  0.9  ECA:  95 cm/sec  LEFT  ICA:  115/21 cm/sec  CCA:  134/17 cm/sec  SYSTOLIC ICA/CCA RATIO:  0.9  DIASTOLIC ICA/CCA RATIO:  1.3  ECA:  115 cm/sec  RIGHT CAROTID ARTERY: There is a minimal amount of eccentric mixed echogenic plaque within the right carotid bulb (image 12), extending to involve the origin and proximal aspects of the right internal carotid artery (image 18), not resulting in elevated peak  systolic velocities within the interrogated course of the right internal carotid artery to suggest a hemodynamically significant stenosis.  RIGHT VERTEBRAL ARTERY:  Antegrade flow  LEFT CAROTID ARTERY: There is a very minimal amount of intimal wall thickening involving the origin and proximal aspects of the left internal carotid artery (image 49), not resulting in elevated peak systolic velocities within the interrogated course of the left internal carotid artery to suggest a hemodynamically significant stenosis.  LEFT VERTEBRAL ARTERY:  Antegrade flow  IMPRESSION: Very minimal amount of bilateral intimal wall thickening and atherosclerotic plaque, right subjectively greater than left, not resulting in a hemodynamically significant stenosis.   Electronically Signed   By: Simonne ComeJohn  Watts M.D.   On: 01/09/2015 10:43   Dg Abd Portable 1v  01/10/2015   CLINICAL DATA:  Nasogastric tube placement.  Esophageal stricture.  EXAM: PORTABLE ABDOMEN - 1 VIEW  COMPARISON:  None.  FINDINGS: Feeding tube is identified within the proximal stomach. Mild motion degradation. Nonobstructive bowel gas pattern. Mid and lower pelvis excluded.  IMPRESSION: Feeding tube terminating at the  proximal stomach.   Electronically Signed   By: Jeronimo Greaves M.D.   On: 01/10/2015 00:19   Ct Head Limited W/o Cm  01/08/2015   CLINICAL DATA:  Cardiac catheterization today. Altered mental status.  EXAM: CT HEAD WITHOUT CONTRAST  TECHNIQUE: Contiguous axial images were obtained from the base of the skull through the vertex without intravenous contrast.  COMPARISON:  None.  FINDINGS: Vascular contrast is identified from preceding cardiac catheterization.  Mild atrophy, appropriate for age.  Negative for hydrocephalus.  Negative for acute or chronic ischemia. Negative for hemorrhage or mass  Normal enhancement.  Calvarium intact.  IMPRESSION: Age related atrophy.  No acute abnormality.  These results will be called to the ordering clinician or  representative by the Radiologist Assistant, and communication documented in the PACS or zVision Dashboard.   Electronically Signed   By: Marlan Palau M.D.   On: 01/08/2015 10:53   Mr Brain Ltd W/o Cm  01/09/2015   CLINICAL DATA:  Expressive a aphasia following placement of cardiac stent 01/08/2015  EXAM: MRI HEAD WITHOUT CONTRAST  TECHNIQUE: Multiplanar, multiecho pulse sequences of the brain and surrounding structures were obtained without intravenous contrast.  COMPARISON:  CT head without contrast 01/08/2015. Carotid ultrasound 01/09/2015.  FINDINGS: Bilateral brainstem infarcts are present. The largest areas in the inferior pons right left paramedian infarct extends over 2 cm anterior to posterior. Additional punctate foci of restricted diffusion are present within the occipital lobes bilaterally in the high left parietal lobe. There is a punctate area of focal infarct along the precentral sulcus on the right.  T2 changes are evident along the infarcts in the brainstem. Mild atrophy and periventricular white matter changes are chronic. No acute hemorrhage is evident.  Flow is present in the major intracranial arteries. There is a thick ram about a cystic lesion at floor of the left maxillary sinus. The collection measures 17 mm maximally and appears P associated with the alveolar ridge. The paranasal sinuses and mastoid air cells are otherwise clear. Skullbase is unremarkable. Midline structures are normal.  IMPRESSION: 1. Acute nonhemorrhagic bilateral infarcts of the brainstem. 2. Acute nonhemorrhagic bilateral occipital lobe infarcts. 3. Acute nonhemorrhagic punctate left parietal lobe infarct. 4. Acute nonhemorrhagic punctate posterior right frontal lobe infarct along the precentral gyrus. 5. Mild atrophy and white matter disease likely reflects the sequela of chronic microvascular ischemia in addition to the acute infarcts. 6. 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 These results were called by telephone at the time of interpretation on 01/09/2015 at 12:08 pm to Dr. Arnoldo Hooker, who verbally acknowledged these results.   Electronically Signed   By: Marin Roberts M.D.   On: 01/09/2015 12:09    CT of the brain no acute abnormality   MRI of the brain  B/l emoblic strokes   MRA of the brain  Pending   Carotid Doppler    2D Echocardiogram  Pending   CXR    EKG  normal EKG, normal sinus rhythm, unchanged from previous tracings. For complete results please see formal report.   Therapy Recommendations pending   Physical Exam Constitutional: Appears well-developed and well-nourished.  Psych: Affect appropriate to situation Eyes: No scleral injection HENT: No OP obstrucion Head: Normocephalic.  Cardiovascular: Normal rate and regular rhythm.  Respiratory: Effort normal and breath sounds normal to anterior ascultation GI: Soft. No distension. There is no tenderness.  Skin:  WDI  Neuro: Mental Status: Patient is awake, alert, awake, interactive and appropriate Patient is able to contribute details to the history.  No signs of neglect. Aphasia is difficult to assess given difficulty with writing due to ataxia and severe dysarthria. I suspect that he does not have any.  Cranial Nerves: II: Visual Fields are full. Pupils are equal, round, and reactive to light.  III,IV, VI: He is unable to cross midlien to the left, his right eye is abel to abduct but his left eye does not follow. He is able to look up and down with both eyes.  V: Facial sensation is symmetric to temperature VII: Facial movement is symmetric.  VIII: hearing is intact to voice Motor: Tone is normal. Bulk is normal. 5/5 strength was present on the left, he has 4/5 strength in the right arm and leg.  Sensory: Sensation is decreased to temp in the right arm, symmetric in the leg.  Deep Tendon  Reflexes: 2+ and symmetric in the biceps and patellae.  Cerebellar: FNF and HKS are intact on the left, ataxic on the right.   ASSESSMENT  Jerome Bowen is a 81 y.o. male s/p cath and then stent placement. Followed by cath generalized weakness with expressive aphasia. MRI b/l strokes in setting of cath.    Unable to swallow in [place].      Hospital day # 1  TREATMENT/PLAN  MRA H/N todaly  Speech eval  Pt/OT  On dual anti plt, not sure if able to swallow. If unable will need rectal ASA  Will d/w family   2d echo  HbA1C    SIGNED Pauletta Browns    To contact Stroke Continuity provider, please refer to [email or web address]. After hours, contact General Neurology

## 2015-01-10 NOTE — Progress Notes (Signed)
[facility]                                                                              Patient Demographics  Jerome Bowen, is a 81 y.o. male, DOB - 1934-04-22, ZOX:[id number]  Admit date - 01/09/2015   Admitting Physician Joseph Art, DO  Outpatient Primary MD for the patient is No PCP Per Patient  LOS - 1   No chief complaint on file.      Brief HPI   Patient is a 81 year old male with hypertension, dyslipidemia, CAD, obstructive sleep apnea on CPAP, transferred from [facility]. Patient was admitted there for elective angiography after his abnormal stress test, underwent PCI with drug-eluting stent for RCA obstruction. During the procedure patient became agitated and confused. Initially it was thought that patient may have had a reaction to versed. CT of the head initially did not reveal any acute abnormality. Due to persistent aphasia and confusion, MRI was obtained which was positive for multifocal strokes. Patient was transferred [facility] for further workup.   Assessment & Plan    Principal Problem:  Acute multifocal CVA (cerebral infarction) in the setting of cardiac cath and then stent placement followed by expressive aphasia - MRI of the brain showed acute nonhemorrhagic bilateral infarcts of brainstem, bilateral occipital lobe infarcts, punctate left parietal lobe infarct, punctate posterior right frontal lobe infarct - MRA pending, 2-D echo pending - Carotid Doppler showed very minimal amount of bilateral intimal wall thickening and atherosclerotic plaque right relatively greater than left but no hemodynamically significant stenosis - Currently NPO, failed swallow testing, has PANDA tube - Neurology following, PT, OT, ST evaluations pending - Currently on aspirin and Plavix however unable to swallow, continue rectal aspirin or through Panda tube. - A1c pending, LDL 93   Active Problems: CAD with recent RCA drug-eluting stent - Continue  with aspirin and Plavix    Essential hypertension - Stable  Dyslipidemia - LDL 93 placed on Lipitor  Code Status: Full code   Family Communication: Discussed in detail with the patient, all imaging results, lab results explained to the patient and wife  Disposition Plan:Not medically ready   Time Spent in minutes  25 minutes   Procedures  MRI brain, CT head brain  Carotid Dopplers   Consults   Neurology   DVT Prophylaxis  heparin  Medications  Scheduled Meds: . aspirin  300 mg Rectal Daily   Or  . aspirin  325 mg Oral Daily  . clopidogrel  75 mg Per NG tube Daily  . famotidine (PEPCID) IV  20 mg Intravenous Q12H  . heparin  5,000 Units Subcutaneous 3 times per day   Continuous Infusions:  PRN Meds:.acetaminophen, sodium chloride   Antibiotics   Anti-infectives    None        Subjective:   Jerome Bowen was seen and examined today. Patient denies dizziness, chest pain, shortness of breath, abdominal pain, N/V/D/C, numbess, tingling. No acute events overnight.  Has significant dysarthria, somewhat frustrated, wife at the bedside  Objective:   Blood pressure 143/78, pulse 77, temperature 98.2 F (36.8 C), temperature source Oral, resp. rate 18,  SpO2 99 %.  Wt Readings from Last 3 Encounters:  01/09/15 81.602 kg (179 lb 14.4 oz)     Intake/Output Summary (Last 24 hours) at 01/10/15 1042 Last data filed at 01/10/15 0020  Gross per 24 hour  Intake     30 ml  Output      0 ml  Net     30 ml    Exam  General: Alert and oriented x 3, NAD, severe dysarthria   HEENT:  PERRLA, EOMI, Anicteic Sclera, mucous membranes moist.   Neck: Supple, no JVD, no masses  CVS: S1 S2 auscultated, no rubs, murmurs or gallops. Regular rate and rhythm.  Respiratory: Clear to auscultation bilaterally, no wheezing, rales or rhonchi  Abdomen: Soft, nontender, nondistended, + bowel sounds  Ext: no cyanosis clubbing or edema  Neuro: AAOx3, Cr N's II- XII. Strength 5/5  upper and lower extremity, Right->4/5 upper and lower extremities   Gait : Not tested   Skin: No rashes  Psych: Normal affect and demeanor, alert and oriented x3    Data Review   Micro Results No results found for this or any previous visit (from the past 240 hour(s)).  Radiology Reports Ct Abdomen Pelvis Wo Contrast  01/08/2015   CLINICAL DATA:  Status post cardiac catheterization. Swelling in the right lower quadrant. Possible hematoma.  EXAM: CT ABDOMEN AND PELVIS WITHOUT CONTRAST  TECHNIQUE: Multidetector CT imaging of the abdomen and pelvis was performed following the standard protocol without IV contrast.  COMPARISON:  None.  FINDINGS: The lung bases appear emphysematous but are clear. No pleural or pericardial effusion is identified.  Contrast material from the patient's cardiac catheterization is seen in the kidneys and urinary bladder. Urinary bladder contrast creates streak artifact in the pelvis. Hemorrhage is seen in the right groin which is not confluent and measures approximately 5.7 cm AP x 8.7 cm transverse by at least 10 cm craniocaudal. Hematoma is also identified in the right pelvis measuring approximately 5.1 cm AP by 3.6 cm transverse by 7.2 cm craniocaudal.  The gallbladder, spleen, adrenal glands, pancreas and kidneys are unremarkable. A tiny low attenuating lesion in the left hepatic lobe anteriorly and near the dome is likely a cyst. The liver is otherwise unremarkable. No lymphadenopathy is seen. Scattered aortoiliac atherosclerosis without aneurysm is noted. The stomach small bowel and appendix appear normal.  There is a superior endplate compression fracture of L2 with vertebral body height loss of approximately 40%. The fracture appears remote but cannot be definitively characterized. No lytic or sclerotic bony lesion is seen.  IMPRESSION: The study is positive for a moderate sized hematoma in the right groin extending into the right pelvis as described above. No other  acute abnormality is identified.  Aortoiliac atherosclerosis without aneurysm.  L2 superior endplate compression fracture appears remote but cannot be definitively characterized.   Electronically Signed   By: Drusilla Kannerhomas  Dalessio M.D.   On: 01/08/2015 10:38   [place] Carotid Bilateral  01/09/2015   CLINICAL DATA:  History of stroke, hypertension, CAD, visual disturbance, hyperlipidemia.  EXAM: BILATERAL CAROTID DUPLEX ULTRASOUND  TECHNIQUE: Wallace CullensGray scale imaging, color Doppler and duplex ultrasound were performed of bilateral carotid and vertebral arteries in the neck.  COMPARISON:  Head CT- 01/08/2015; 12/19/2014  FINDINGS: Criteria: Quantification of carotid stenosis is based on velocity parameters that correlate the residual internal carotid diameter with NASCET-based stenosis levels, using the diameter of the distal internal carotid lumen as the denominator for stenosis measurement.  The following velocity measurements  were obtained:  RIGHT  ICA:  104/15 cm/sec  CCA:  133/18 cm/sec  SYSTOLIC ICA/CCA RATIO:  0.8  DIASTOLIC ICA/CCA RATIO:  0.9  ECA:  95 cm/sec  LEFT  ICA:  115/21 cm/sec  CCA:  134/17 cm/sec  SYSTOLIC ICA/CCA RATIO:  0.9  DIASTOLIC ICA/CCA RATIO:  1.3  ECA:  115 cm/sec  RIGHT CAROTID ARTERY: There is a minimal amount of eccentric mixed echogenic plaque within the right carotid bulb (image 12), extending to involve the origin and proximal aspects of the right internal carotid artery (image 18), not resulting in elevated peak systolic velocities within the interrogated course of the right internal carotid artery to suggest a hemodynamically significant stenosis.  RIGHT VERTEBRAL ARTERY:  Antegrade flow  LEFT CAROTID ARTERY: There is a very minimal amount of intimal wall thickening involving the origin and proximal aspects of the left internal carotid artery (image 49), not resulting in elevated peak systolic velocities within the interrogated course of the left internal carotid artery to suggest a  hemodynamically significant stenosis.  LEFT VERTEBRAL ARTERY:  Antegrade flow  IMPRESSION: Very minimal amount of bilateral intimal wall thickening and atherosclerotic plaque, right subjectively greater than left, not resulting in a hemodynamically significant stenosis.   Electronically Signed   By: Simonne Come M.D.   On: 01/09/2015 10:43   Dg Abd Portable 1v  01/10/2015   CLINICAL DATA:  Nasogastric tube placement.  Esophageal stricture.  EXAM: PORTABLE ABDOMEN - 1 VIEW  COMPARISON:  None.  FINDINGS: Feeding tube is identified within the proximal stomach. Mild motion degradation. Nonobstructive bowel gas pattern. Mid and lower pelvis excluded.  IMPRESSION: Feeding tube terminating at the proximal stomach.   Electronically Signed   By: Jeronimo Greaves M.D.   On: 01/10/2015 00:19   Ct Head Limited W/o Cm  01/08/2015   CLINICAL DATA:  Cardiac catheterization today. Altered mental status.  EXAM: CT HEAD WITHOUT CONTRAST  TECHNIQUE: Contiguous axial images were obtained from the base of the skull through the vertex without intravenous contrast.  COMPARISON:  None.  FINDINGS: Vascular contrast is identified from preceding cardiac catheterization.  Mild atrophy, appropriate for age.  Negative for hydrocephalus.  Negative for acute or chronic ischemia. Negative for hemorrhage or mass  Normal enhancement.  Calvarium intact.  IMPRESSION: Age related atrophy.  No acute abnormality.  These results will be called to the ordering clinician or representative by the Radiologist Assistant, and communication documented in the PACS or zVision Dashboard.   Electronically Signed   By: Marlan Palau M.D.   On: 01/08/2015 10:53   Mr Brain Ltd W/o Cm  01/09/2015   CLINICAL DATA:  Expressive a aphasia following placement of cardiac stent 01/08/2015  EXAM: MRI HEAD WITHOUT CONTRAST  TECHNIQUE: Multiplanar, multiecho pulse sequences of the brain and surrounding structures were obtained without intravenous contrast.  COMPARISON:  CT  head without contrast 01/08/2015. Carotid ultrasound 01/09/2015.  FINDINGS: Bilateral brainstem infarcts are present. The largest areas in the inferior pons right left paramedian infarct extends over 2 cm anterior to posterior. Additional punctate foci of restricted diffusion are present within the occipital lobes bilaterally in the high left parietal lobe. There is a punctate area of focal infarct along the precentral sulcus on the right.  T2 changes are evident along the infarcts in the brainstem. Mild atrophy and periventricular white matter changes are chronic. No acute hemorrhage is evident.  Flow is present in the major intracranial arteries. There is a thick ram about a cystic  lesion at floor of the left maxillary sinus. The collection measures 17 mm maximally and appears P associated with the alveolar ridge. The paranasal sinuses and mastoid air cells are otherwise clear. Skullbase is unremarkable. Midline structures are normal.  IMPRESSION: 1. Acute nonhemorrhagic bilateral infarcts of the brainstem. 2. Acute nonhemorrhagic bilateral occipital lobe infarcts. 3. Acute nonhemorrhagic punctate left parietal lobe infarct. 4. Acute nonhemorrhagic punctate posterior right frontal lobe infarct along the precentral gyrus. 5. Mild atrophy and white matter disease likely reflects the sequela of chronic microvascular ischemia in addition to the acute infarcts. 6. 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 These results were called by telephone at the time of interpretation on 01/09/2015 at 12:08 pm to Dr. Arnoldo Hooker, who verbally acknowledged these results.   Electronically Signed   By: Marin Roberts M.D.   On: 01/09/2015 12:09    CBC  Recent Labs Lab 01/09/15 2304 01/10/15 0744  WBC 8.5 8.0  HGB 11.5* 12.3*  HCT 33.0* 35.6*  PLT 209 211  MCV 93.0 93.7  MCH 32.4 32.4  MCHC 34.8 34.6  RDW 13.2  13.2  LYMPHSABS 1.6  --   MONOABS 0.8  --   EOSABS 0.1  --   BASOSABS 0.0  --     Chemistries   Recent Labs Lab 01/09/15 2304 01/10/15 0744  NA 142 140  K 3.8 3.9  CL 110 110  CO2 23 22  GLUCOSE 101* 94  BUN 19 18  CREATININE 0.96 0.92  CALCIUM 8.5 8.7  AST 21  --   ALT 20  --   ALKPHOS 45  --   BILITOT 0.8  --    ------------------------------------------------------------------------------------------------------------------ estimated creatinine clearance is 63 mL/min (by C-G formula based on Cr of 0.92). ------------------------------------------------------------------------------------------------------------------ No results for input(s): HGBA1C in the last 72 hours. ------------------------------------------------------------------------------------------------------------------  Recent Labs  01/09/15 2304  CHOL 149  HDL 42  LDLCALC 93  TRIG 68  CHOLHDL 3.5   ------------------------------------------------------------------------------------------------------------------ No results for input(s): TSH, T4TOTAL, T3FREE, THYROIDAB in the last 72 hours.  Invalid input(s): FREET3 ------------------------------------------------------------------------------------------------------------------ No results for input(s): VITAMINB12, FOLATE, FERRITIN, TIBC, IRON, RETICCTPCT in the last 72 hours.  Coagulation profile  Recent Labs Lab 01/09/15 2304  INR 1.18    No results for input(s): DDIMER in the last 72 hours.  Cardiac Enzymes No results for input(s): CKMB, TROPONINI, MYOGLOBIN in the last 168 hours.  Invalid input(s): CK ------------------------------------------------------------------------------------------------------------------ Invalid input(s): POCBNP   Recent Labs  01/10/15 0002 01/10/15 0617  GLUCAP 95 93     Promyse Ardito M.D. [facility] 01/10/2015, 10:42 AM  Pager: [phone]   Between 7am to 7pm - call Pager -  [phone]  After 7pm go to [email or web address] - password [email or web address]  Call night coverage person covering after 7pm

## 2015-01-10 NOTE — Progress Notes (Signed)
Pt has home cpap at bedside, pt stated he did not want to wear cpap due to NG tube. RT informed pt to let RN know to call RT if changes his mind during the night about cpap

## 2015-01-10 NOTE — Progress Notes (Signed)
Patient is refusing to wear CPAP at this time due to the discomfort of the mask. RT informed RN if he changed his mind throughout the night to contact RT.

## 2015-01-10 NOTE — Progress Notes (Signed)
PT Cancellation Note  Patient Details Name: Jerome Bowen MRN: [id number] DOB: [birth year]   Cancelled Treatment:    Reason Eval/Treat Not Completed: Patient at procedure or test/unavailable. Pt in MRI in AM. PT to check back in PM.   Ilda FoilGarrow, [name] 01/10/2015, 10:31 AM

## 2015-01-10 NOTE — Evaluation (Signed)
Physical Therapy Evaluation Patient Details Name: Jerome Bowen MRN: [id number] DOB: Mar 06, 1934 Today's Date: 01/10/2015   History of Present Illness  81 yo male [facility] elective angiography undergoing PCI with drug eluting stent for RCA obstruction. MRI (+) acute bil infarcts brainstem bil occipital infarcts punctate L parietal lobe punctate posterior R frontal infarct  PMH: L eye CVA only use of Lower two quadrants,  CAD sleep apnea and HTN  Clinical Impression  Pt admitted with above diagnosis. Pt currently with functional limitations due to the deficits listed below (see PT Problem List). +2 mod assist required on eval for bed mobility and transfers. Lateral weight shifting performed in stance with RW. Assist needed to maintain stance as well as to fully weight shift over RLE. Knee buckling noted on right with full weight bearing. Eval limited to bedside as pt was being transported for carotid dopplers. Pt will benefit from skilled PT to increase their independence and safety with mobility to allow discharge to the venue listed below.       Follow Up Recommendations CIR;Supervision/Assistance - 24 hour    Equipment Recommendations  Rolling walker with 5" wheels    Recommendations for Other Services Rehab consult     Precautions / Restrictions Precautions Precautions: Fall;Other (comment) Precaution Comments: panda      Mobility  Bed Mobility Overal bed mobility: Needs Assistance;+2 for physical assistance Bed Mobility: Supine to Sit;Sit to Supine;Rolling Rolling: Min assist   Supine to sit: +2 for physical assistance;Mod assist Sit to supine: +2 for physical assistance;Min assist   General bed mobility comments: verbal/tactile cueing for sequencing  Transfers Overall transfer level: Needs assistance Equipment used: Rolling walker (2 wheeled) Transfers: Sit to/from Stand Sit to Stand: +2 physical assistance;Mod assist         General transfer  comment: verbal cues for hand placement  Ambulation/Gait                Stairs            Wheelchair Mobility    Modified Rankin (Stroke Patients Only) Modified Rankin (Stroke Patients Only) Pre-Morbid Rankin Score: No symptoms Modified Rankin: Moderately severe disability     Balance Overall balance assessment: Needs assistance Sitting-balance support: Feet supported;Bilateral upper extremity supported Sitting balance-Leahy Scale: Fair   Postural control: Right lateral lean Standing balance support: Bilateral upper extremity supported Standing balance-Leahy Scale: Poor                               Pertinent Vitals/Pain Pain Assessment: No/denies pain    Home Living Family/patient expects to be discharged to:: Private residence Living Arrangements: Spouse/significant other Available Help at Discharge: Family Type of Home: House Home Access: Stairs to enter Entrance Stairs-Rails: Right Entrance Stairs-Number of Steps: 2 Home Layout: Two level;Able to live on main level with bedroom/bathroom Home Equipment: None      Prior Function Level of Independence: Independent               Hand Dominance   Dominant Hand: Right    Extremity/Trunk Assessment   Upper Extremity Assessment: Defer to OT evaluation RUE Deficits / Details: decr fine motor and gross motor. Pt able to hold a pen and write name/ wife name . Pt able to alternate to the L hand and complete task in print with more legible text         Lower Extremity Assessment: RLE deficits/detail RLE  Deficits / Details: 4/5 strength grossly graded    Cervical / Trunk Assessment: Normal  Communication   Communication: Expressive difficulties  Cognition Arousal/Alertness: Awake/alert Behavior During Therapy: WFL for tasks assessed/performed Overall Cognitive Status: Within Functional Limits for tasks assessed                      General Comments      Exercises         Assessment/Plan    PT Assessment Patient needs continued PT services  PT Diagnosis Abnormality of gait   PT Problem List Decreased strength;Decreased activity tolerance;Decreased balance;Decreased mobility;Decreased coordination;Decreased knowledge of precautions;Decreased knowledge of use of DME  PT Treatment Interventions DME instruction;Gait training;Stair training;Functional mobility training;Therapeutic activities;Therapeutic exercise;Patient/family education;Neuromuscular re-education;Balance training   PT Goals (Current goals can be found in the Care Plan section) Acute Rehab PT Goals Patient Stated Goal: expressive deficits and no goal provided written at this time PT Goal Formulation: With patient Time For Goal Achievement: 01/24/15 Potential to Achieve Goals: Good    Frequency Min 4X/week   Barriers to discharge        Co-evaluation               End of Session Equipment Utilized During Treatment: Gait belt Activity Tolerance: Patient limited by fatigue Patient left: in bed;with family/visitor present;with nursing/sitter in room;with call bell/phone within reach Nurse Communication: Mobility status         Time: 8119-1478 PT Time Calculation (min) (ACUTE ONLY): 27 min   Charges:   PT Evaluation $Initial PT Evaluation Tier I: 1 Procedure PT Treatments $Therapeutic Activity: 8-22 mins   PT G Codes:        Ilda Foil 01/10/2015, 3:13 PM

## 2015-01-11 DIAGNOSIS — I6789 Other cerebrovascular disease: Secondary | ICD-10-CM

## 2015-01-11 DIAGNOSIS — I6310 Cerebral infarction due to embolism of unspecified precerebral artery: Secondary | ICD-10-CM

## 2015-01-11 DIAGNOSIS — I2510 Atherosclerotic heart disease of native coronary artery without angina pectoris: Secondary | ICD-10-CM | POA: Insufficient documentation

## 2015-01-11 DIAGNOSIS — I69391 Dysphagia following cerebral infarction: Secondary | ICD-10-CM | POA: Insufficient documentation

## 2015-01-11 LAB — GLUCOSE, CAPILLARY
GLUCOSE-CAPILLARY: 110 mg/dL — AB (ref 70–99)
Glucose-Capillary: 102 mg/dL — ABNORMAL HIGH (ref 70–99)
Glucose-Capillary: 109 mg/dL — ABNORMAL HIGH (ref 70–99)

## 2015-01-11 MED ORDER — JEVITY 1.2 CAL PO LIQD
1000.0000 mL | ORAL | Status: DC
  Administered 2015-01-11: 20 mL/h
  Filled 2015-01-11 (×2): qty 1000
  Filled 2015-01-11: qty 237
  Filled 2015-01-11: qty 1000

## 2015-01-11 NOTE — Progress Notes (Signed)
RT Note:  Pt uses home cpap but is not wearing tonight due to nasal tube and discomfort.  Rt will continue to monitor.

## 2015-01-11 NOTE — Progress Notes (Signed)
[facility]                                                                              Patient Demographics  Jerome Bowen, is a 81 y.o. male, DOB - [birth year], JXB:[id number]  Admit date - 01/09/2015   Admitting Physician Joseph Art, DO  Outpatient Primary MD for the patient is No PCP Per Patient  LOS - 2   No chief complaint on file.      Brief HPI   Patient is a 81 year old male with hypertension, dyslipidemia, CAD, obstructive sleep apnea on CPAP, transferred from [facility]. Patient was admitted there for elective angiography after his abnormal stress test, underwent PCI with drug-eluting stent for RCA obstruction. During the procedure patient became agitated and confused. Initially it was thought that patient may have had a reaction to versed. CT of the head initially did not reveal any acute abnormality. Due to persistent aphasia and confusion, MRI was obtained which was positive for multifocal strokes. Patient was transferred [facility] for further workup.  Subjective:   The patient is alert. His wife and daughter at the bedside and saying that his speech is much better than yesterday. The patient himself states that he feels fine. We have discussed whether or not he would want a PEG tube and initially he states no but later is agreeing to it if it is necessary. He has no complaints of shortness of breath, pain or nausea.  Assessment & Plan    Principal Problem:  Acute multifocal CVA (cerebral infarction) in the setting of cardiac cath and then stent placement followed by expressive aphasia and dysphagia - MRI of the brain showed acute nonhemorrhagic bilateral infarcts of brainstem, bilateral occipital lobe infarcts, punctate left parietal lobe infarct, punctate posterior right frontal lobe infarct - MRA - reveals a left vertebral artery which was occluded at the origin. Reconstitution of left vertebral artery at the skull base supplying left PICA  and giving minimal contribution to basilar-no other stenosis noted - 2-D echo pending - Carotid Doppler showed very minimal amount of bilateral intimal wall thickening and atherosclerotic plaque right relatively greater than left but no hemodynamically significant stenosis -PT, OT recommending CIR-consult has been placed - Currently on aspirin and Plavix  - A1c pending, LDL 93   Active Problems:  Dysphagia -Secondary to CVA - Currently NPO, failed swallow testing, has PANDA tube- have requested dietitian consult to start tube feeds via Panda tube -Neurology recommending to wait another 2-3 days to see if there is improvement in swallowing-if not, a PEG tube will need to be placed prior to discharge from the hospital -Once tube feeds have been advanced to maximum rate, IV fluids can be discontinued  CAD with recent RCA drug-eluting stent - Continue with aspirin and Plavix    Essential hypertension - Stable  Dyslipidemia - LDL 93 placed on Lipitor  Code Status: Full code   Family Communication: Discussed in detail with the patient wife and daughter at bedside  Disposition Plan:  Further assessment for need for PEG tube  Time Spent in minutes  25 minutes   Procedures  MRI brain, CT  head brain  Carotid Dopplers   Consults   Neurology   DVT Prophylaxis  heparin  Medications  Scheduled Meds: . antiseptic oral rinse  7 mL Mouth Rinse BID  . aspirin  300 mg Rectal Daily   Or  . aspirin  325 mg Oral Daily  . atorvastatin  10 mg Per Tube q1800  . clopidogrel  75 mg Per NG tube Daily  . famotidine (PEPCID) IV  20 mg Intravenous Q12H  . heparin  5,000 Units Subcutaneous 3 times per day   Continuous Infusions: . sodium chloride 75 mL/hr at 01/11/15 0926  . feeding supplement (JEVITY 1.2 CAL) 20 mL/hr (01/11/15 1138)   PRN Meds:.acetaminophen, sodium chloride   Antibiotics   Anti-infectives    None      Objective:   Blood pressure 150/72, pulse 81, temperature  99.5 F (37.5 C), temperature source Axillary, resp. rate 20, SpO2 99 %.  Wt Readings from Last 3 Encounters:  01/09/15 81.602 kg (179 lb 14.4 oz)    No intake or output data in the 24 hours ending 01/11/15 1346  Exam  General: Alert and oriented x 3, NAD, severe dysarthria, having difficulty clearing secretions in the back of his throat  HEENT:  PERRLA, EOMI, Anicteic Sclera, mucous membranes moist.   Neck: Supple, no JVD, no masses  CVS: S1 S2 auscultated, no rubs, murmurs or gallops. Regular rate and rhythm.  Respiratory: Clear to auscultation bilaterally, no wheezing, rales or rhonchi  Abdomen: Soft, nontender, nondistended, + bowel sounds  Ext: no cyanosis clubbing or edema  Neuro: AAOx3, Cr N's II- XII. Strength 5/5 upper and lower extremity, Right->4/5 upper and lower extremities   Gait : Not tested   Skin: No rashes  Psych: Normal affect and demeanor, alert and oriented x3    Data Review   Micro Results No results found for this or any previous visit (from the past 240 hour(s)).  Radiology Reports Ct Abdomen Pelvis Wo Contrast  01/08/2015   CLINICAL DATA:  Status post cardiac catheterization. Swelling in the right lower quadrant. Possible hematoma.  EXAM: CT ABDOMEN AND PELVIS WITHOUT CONTRAST  TECHNIQUE: Multidetector CT imaging of the abdomen and pelvis was performed following the standard protocol without IV contrast.  COMPARISON:  None.  FINDINGS: The lung bases appear emphysematous but are clear. No pleural or pericardial effusion is identified.  Contrast material from the patient's cardiac catheterization is seen in the kidneys and urinary bladder. Urinary bladder contrast creates streak artifact in the pelvis. Hemorrhage is seen in the right groin which is not confluent and measures approximately 5.7 cm AP x 8.7 cm transverse by at least 10 cm craniocaudal. Hematoma is also identified in the right pelvis measuring approximately 5.1 cm AP by 3.6 cm transverse by  7.2 cm craniocaudal.  The gallbladder, spleen, adrenal glands, pancreas and kidneys are unremarkable. A tiny low attenuating lesion in the left hepatic lobe anteriorly and near the dome is likely a cyst. The liver is otherwise unremarkable. No lymphadenopathy is seen. Scattered aortoiliac atherosclerosis without aneurysm is noted. The stomach small bowel and appendix appear normal.  There is a superior endplate compression fracture of L2 with vertebral body height loss of approximately 40%. The fracture appears remote but cannot be definitively characterized. No lytic or sclerotic bony lesion is seen.  IMPRESSION: The study is positive for a moderate sized hematoma in the right groin extending into the right pelvis as described above. No other acute abnormality is identified.  Aortoiliac atherosclerosis  without aneurysm.  L2 superior endplate compression fracture appears remote but cannot be definitively characterized.   Electronically Signed   By: Drusilla Kannerhomas  Dalessio M.D.   On: 01/08/2015 10:38   Mr Angiogram Neck W Wo Contrast  01/10/2015   CLINICAL DATA:  Acute brainstem infarction.  EXAM: MRA NECK WITHOUT AND WITH CONTRAST  MRA HEAD WITHOUT CONTRAST  TECHNIQUE: Multiplanar and multiecho pulse sequences of the neck were obtained without and with intravenous contrast. Angiographic images of the neck were obtained using MRA technique without and with intravenous contast.; Angiographic images of the Circle of Willis were obtained using MRA technique without intravenous contrast.  CONTRAST:  20mL MULTIHANCE GADOBENATE DIMEGLUMINE 529 MG/ML IV SOLN  COMPARISON:  MRI 01/09/2015.  Ultrasound 01/09/2015.  FINDINGS: MRA NECK FINDINGS  Branching pattern of the brachiocephalic vessels from the arch is normal. No origin stenoses.  Both common carotid arteries are widely patent to their respective bifurcation. Both carotid bifurcations are normal without narrowing or irregularity. Both cervical internal carotid arteries are  tortuous but widely patent an otherwise normal.  The right vertebral artery is patent at its origin. I think there is narrowing on the order of 30%. Beyond that, the vessel is widely patent through the cervical region.  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 There does not appear to be any reconstitution until the skullbase level where there is probably minor reconstitution of the distal left vertebral artery, supplying at least left PICA. There may be minimal reconstituted flow contributed to the basilar.  MRA HEAD FINDINGS  Both internal carotid arteries are widely patent through the skullbase. No siphon stenosis. The anterior and middle cerebral vessels are patent without proximal stenosis, aneurysm or vascular malformation. The right PCA takes a fetal origin from the anterior circulation. There is a large posterior communicating artery on the left, but with some communication with the distal basilar.  The right vertebral artery is a large vessel that supplies the basilar. No basilar stenosis. No antegrade flow is seen in the left vertebral artery, though this study is not as sensitive as information gleaned from the contrast-enhanced study, which showed some reconstitution of the distal left vertebral artery by cervical collaterals at the skullbase, supplying the left PICA and with minimal contribution to the basilar. Both superior cerebellar arteries show flow. As noted previously, the right PCA receives it supply from the anterior circulation. The left PCA receives it supply from both the basilar tip and the anterior circulation.  IMPRESSION: No anterior circulation disease identified.  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 Reconstitution of the distal left vertebral artery at the skullbase level, supplying left PICA and giving minimal contribution to the basilar.  No basilar stenosis. Superior cerebellar and posterior cerebral arteries show flow presently.   Electronically Signed   By: Paulina FusiMark   Shogry M.D.   On: 01/10/2015 11:48   [place] Carotid Bilateral  01/09/2015   CLINICAL DATA:  History of stroke, hypertension, CAD, visual disturbance, hyperlipidemia.  EXAM: BILATERAL CAROTID DUPLEX ULTRASOUND  TECHNIQUE: Wallace CullensGray scale imaging, color Doppler and duplex ultrasound were performed of bilateral carotid and vertebral arteries in the neck.  COMPARISON:  Head CT- 01/08/2015; 12/19/2014  FINDINGS: Criteria: Quantification of carotid stenosis is based on velocity parameters that correlate the residual internal carotid diameter with NASCET-based stenosis levels, using the diameter of the distal internal carotid lumen as the denominator for stenosis measurement.  The following velocity measurements were obtained:  RIGHT  ICA:  104/15 cm/sec  CCA:  133/18 cm/sec  SYSTOLIC ICA/CCA RATIO:  0.8  DIASTOLIC ICA/CCA RATIO:  0.9  ECA:  95 cm/sec  LEFT  ICA:  115/21 cm/sec  CCA:  134/17 cm/sec  SYSTOLIC ICA/CCA RATIO:  0.9  DIASTOLIC ICA/CCA RATIO:  1.3  ECA:  115 cm/sec  RIGHT CAROTID ARTERY: There is a minimal amount of eccentric mixed echogenic plaque within the right carotid bulb (image 12), extending to involve the origin and proximal aspects of the right internal carotid artery (image 18), not resulting in elevated peak systolic velocities within the interrogated course of the right internal carotid artery to suggest a hemodynamically significant stenosis.  RIGHT VERTEBRAL ARTERY:  Antegrade flow  LEFT CAROTID ARTERY: There is a very minimal amount of intimal wall thickening involving the origin and proximal aspects of the left internal carotid artery (image 49), not resulting in elevated peak systolic velocities within the interrogated course of the left internal carotid artery to suggest a hemodynamically significant stenosis.  LEFT VERTEBRAL ARTERY:  Antegrade flow  IMPRESSION: Very minimal amount of bilateral intimal wall thickening and atherosclerotic plaque, right subjectively greater than left, not resulting in  a hemodynamically significant stenosis.   Electronically Signed   By: Simonne Come M.D.   On: 01/09/2015 10:43   Dg Abd Portable 1v  01/10/2015   CLINICAL DATA:  Nasogastric tube placement.  Esophageal stricture.  EXAM: PORTABLE ABDOMEN - 1 VIEW  COMPARISON:  None.  FINDINGS: Feeding tube is identified within the proximal stomach. Mild motion degradation. Nonobstructive bowel gas pattern. Mid and lower pelvis excluded.  IMPRESSION: Feeding tube terminating at the proximal stomach.   Electronically Signed   By: Jeronimo Greaves M.D.   On: 01/10/2015 00:19   Dg Swallowing Func-speech Pathology  01/10/2015    Objective Swallowing Evaluation:    Patient Details  Name: Jerome Bowen MRN: [id number] Date of Birth: 06/01/1934  Today's Date: 01/10/2015 Time: SLP Start Time (ACUTE ONLY): 1430-SLP Stop Time (ACUTE ONLY): 1500 SLP Time Calculation (min) (ACUTE ONLY): 30 min  Past Medical History:  Past Medical History  Diagnosis Date  . Essential hypertension   . Dyslipidemia   . CAD (coronary artery disease)   . S/P right coronary artery (RCA) stent placement   . OSA (obstructive sleep apnea)   . Esophageal stricture   . Seronegative arthritis   . Fatty liver    Past Surgical History: No past surgical history on file. HPI:  Other Pertinent Information: TANUJ MULLENS is a 81 y.o. male with past  medical history of hypertension, dyslipidemia, coronary artery disease,  obstructive sleep apnea on C Pap, seronegative arthritis. The patient was  brought in from [facility].  No Data Recorded  Assessment / Plan / Recommendation CHL IP CLINICAL IMPRESSIONS 01/10/2015  Therapy Diagnosis Severe oral phase dysphagia;Moderate pharyngeal phase  dysphagia  Clinical Impression MBSS completed.  Pt presents with severe oral and  moderate pharyngeal dysphagia c/b severe oral discoordination and poor a/p  transit of the bolus.  The pt was unable to adequately manipulate thins or  purees.  The entire thin bolus was anteriorly lost and the pt  was unable  to move the puree bolus into the pharynx requiring oral suctioning.   Pharyngeally the pt presented with a delayed swallow trigger, decreased  constriction with mild vallecular and pyriform residue and decreased  epiglottic deflection with tsp boluses of honey.  Given the pt's current  swallowing ability rx keep the pt NPO.  Rx therapeutic feeds of honey  liquids via tsp sips with the SLP only.  The pt will require intense ST  f/u at the next level of care.        CHL IP TREATMENT RECOMMENDATION 01/10/2015  Treatment Recommendations Therapy as outlined in treatment plan below     CHL IP DIET RECOMMENDATION 01/10/2015  SLP Diet Recommendations NPO  Liquid Administration via (None)  Medication Administration Via alternative means  Compensations (None)  Postural Changes and/or Swallow Maneuvers (None)     CHL IP OTHER RECOMMENDATIONS 01/10/2015  Recommended Consults (None)  Oral Care Recommendations Oral care BID  Other Recommendations (None)     CHL IP FOLLOW UP RECOMMENDATIONS 01/10/2015  Follow up Recommendations Inpatient Rehab     CHL IP FREQUENCY AND DURATION 01/10/2015  Speech Therapy Frequency (ACUTE ONLY) min 2x/week  Treatment Duration 2 weeks         CHL IP REASON FOR REFERRAL 01/10/2015  Reason for Referral Objectively evaluate swallowing function     CHL IP ORAL PHASE 01/10/2015  Lips (None)  Tongue (None)  Mucous membranes (None)  Nutritional status (None)  Other (None)  Oxygen therapy (None)  Oral Phase Impaired  Oral - Pudding Teaspoon (None)  Oral - Pudding Cup (None)  Oral - Honey Teaspoon (None)  Oral - Honey Cup (None)  Oral - Honey Syringe (None)  Oral - Nectar Teaspoon (None)  Oral - Nectar Cup (None)  Oral - Nectar Straw (None)  Oral - Nectar Syringe (None)  Oral - Ice Chips (None)  Oral - Thin Teaspoon (None)  Oral - Thin Cup (None)  Oral - Thin Straw (None)  Oral - Thin Syringe (None)  Oral - Puree (None)  Oral - Mechanical Soft (None)  Oral - Regular (None)  Oral - Multi-consistency  (None)  Oral - Pill (None)  Oral Phase - Comment (None)      CHL IP PHARYNGEAL PHASE 01/10/2015  Pharyngeal Phase Impaired  Pharyngeal - Pudding Teaspoon (None)  Penetration/Aspiration details (pudding teaspoon) (None)  Pharyngeal - Pudding Cup (None)  Penetration/Aspiration details (pudding cup) (None)  Pharyngeal - Honey Teaspoon (None)  Penetration/Aspiration details (honey teaspoon) (None)  Pharyngeal - Honey Cup (None)  Penetration/Aspiration details (honey cup) (None)  Pharyngeal - Honey Syringe (None)  Penetration/Aspiration details (honey syringe) (None)  Pharyngeal - Nectar Teaspoon (None)  Penetration/Aspiration details (nectar teaspoon) (None)  Pharyngeal - Nectar Cup (None)  Penetration/Aspiration details (nectar cup) (None)  Pharyngeal - Nectar Straw (None)  Penetration/Aspiration details (nectar straw) (None)  Pharyngeal - Nectar Syringe (None)  Penetration/Aspiration details (nectar syringe) (None)  Pharyngeal - Ice Chips (None)  Penetration/Aspiration details (ice chips) (None)  Pharyngeal - Thin Teaspoon (None)  Penetration/Aspiration details (thin teaspoon) (None)  Pharyngeal - Thin Cup (None)  Penetration/Aspiration details (thin cup) (None)  Pharyngeal - Thin Straw (None)  Penetration/Aspiration details (thin straw) (None)  Pharyngeal - Thin Syringe (None)  Penetration/Aspiration details (thin syringe') (None)  Pharyngeal - Puree (None)  Penetration/Aspiration details (puree) (None)  Pharyngeal - Mechanical Soft (None)  Penetration/Aspiration details (mechanical soft) (None)  Pharyngeal - Regular (None)  Penetration/Aspiration details (regular) (None)  Pharyngeal - Multi-consistency (None)  Penetration/Aspiration details (multi-consistency) (None)  Pharyngeal - Pill (None)  Penetration/Aspiration details (pill) (None)  Pharyngeal Comment (None)             Fleet Contras 01/10/2015, 3:26 PM  Dimas Aguas, MA, CCC-SLP Acute Rehab SLP [phone]]     Mr Maxine Glenn Head/brain Wo Cm  01/10/2015    CLINICAL DATA:  Acute brainstem  infarction.  EXAM: MRA NECK WITHOUT AND WITH CONTRAST  MRA HEAD WITHOUT CONTRAST  TECHNIQUE: Multiplanar and multiecho pulse sequences of the neck were obtained without and with intravenous contrast. Angiographic images of the neck were obtained using MRA technique without and with intravenous contast.; Angiographic images of the Circle of Willis were obtained using MRA technique without intravenous contrast.  CONTRAST:  20mL MULTIHANCE GADOBENATE DIMEGLUMINE 529 MG/ML IV SOLN  COMPARISON:  MRI 01/09/2015.  Ultrasound 01/09/2015.  FINDINGS: MRA NECK FINDINGS  Branching pattern of the brachiocephalic vessels from the arch is normal. No origin stenoses.  Both common carotid arteries are widely patent to their respective bifurcation. Both carotid bifurcations are normal without narrowing or irregularity. Both cervical internal carotid arteries are tortuous but widely patent an otherwise normal.  The right vertebral artery is patent at its origin. I think there is narrowing on the order of 30%. Beyond that, the vessel is widely patent through the cervical region.  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 There does not appear to be any reconstitution until the skullbase level where there is probably minor reconstitution of the distal left vertebral artery, supplying at least left PICA. There may be minimal reconstituted flow contributed to the basilar.  MRA HEAD FINDINGS  Both internal carotid arteries are widely patent through the skullbase. No siphon stenosis. The anterior and middle cerebral vessels are patent without proximal stenosis, aneurysm or vascular malformation. The right PCA takes a fetal origin from the anterior circulation. There is a large posterior communicating artery on the left, but with some communication with the distal basilar.  The right vertebral artery is a large vessel that supplies the basilar. No basilar stenosis. No antegrade flow is seen in the left  vertebral artery, though this study is not as sensitive as information gleaned from the contrast-enhanced study, which showed some reconstitution of the distal left vertebral artery by cervical collaterals at the skullbase, supplying the left PICA and with minimal contribution to the basilar. Both superior cerebellar arteries show flow. As noted previously, the right PCA receives it supply from the anterior circulation. The left PCA receives it supply from both the basilar tip and the anterior circulation.  IMPRESSION: No anterior circulation disease identified.  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 Reconstitution of the distal left vertebral artery at the skullbase level, supplying left PICA and giving minimal contribution to the basilar.  No basilar stenosis. Superior cerebellar and posterior cerebral arteries show flow presently.   Electronically Signed   By: Paulina Fusi M.D.   On: 01/10/2015 11:48   Ct Head Limited W/o Cm  01/08/2015   CLINICAL DATA:  Cardiac catheterization today. Altered mental status.  EXAM: CT HEAD WITHOUT CONTRAST  TECHNIQUE: Contiguous axial images were obtained from the base of the skull through the vertex without intravenous contrast.  COMPARISON:  None.  FINDINGS: Vascular contrast is identified from preceding cardiac catheterization.  Mild atrophy, appropriate for age.  Negative for hydrocephalus.  Negative for acute or chronic ischemia. Negative for hemorrhage or mass  Normal enhancement.  Calvarium intact.  IMPRESSION: Age related atrophy.  No acute abnormality.  These results will be called to the ordering clinician or representative by the Radiologist Assistant, and communication documented in the PACS or zVision Dashboard.   Electronically Signed   By: Marlan Palau M.D.   On: 01/08/2015 10:53   Mr Brain Ltd W/o Cm  01/09/2015   CLINICAL DATA:  Expressive a aphasia following placement of cardiac  stent 01/08/2015  EXAM: MRI HEAD WITHOUT CONTRAST  TECHNIQUE:  Multiplanar, multiecho pulse sequences of the brain and surrounding structures were obtained without intravenous contrast.  COMPARISON:  CT head without contrast 01/08/2015. Carotid ultrasound 01/09/2015.  FINDINGS: Bilateral brainstem infarcts are present. The largest areas in the inferior pons right left paramedian infarct extends over 2 cm anterior to posterior. Additional punctate foci of restricted diffusion are present within the occipital lobes bilaterally in the high left parietal lobe. There is a punctate area of focal infarct along the precentral sulcus on the right.  T2 changes are evident along the infarcts in the brainstem. Mild atrophy and periventricular white matter changes are chronic. No acute hemorrhage is evident.  Flow is present in the major intracranial arteries. There is a thick ram about a cystic lesion at floor of the left maxillary sinus. The collection measures 17 mm maximally and appears P associated with the alveolar ridge. The paranasal sinuses and mastoid air cells are otherwise clear. Skullbase is unremarkable. Midline structures are normal.  IMPRESSION: 1. Acute nonhemorrhagic bilateral infarcts of the brainstem. 2. Acute nonhemorrhagic bilateral occipital lobe infarcts. 3. Acute nonhemorrhagic punctate left parietal lobe infarct. 4. Acute nonhemorrhagic punctate posterior right frontal lobe infarct along the precentral gyrus. 5. Mild atrophy and white matter disease likely reflects the sequela of chronic microvascular ischemia in addition to the acute infarcts. 6. 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 These results were called by telephone at the time of interpretation on 01/09/2015 at 12:08 pm to Dr. Arnoldo Hooker, who verbally acknowledged these results.   Electronically Signed   By: Marin Roberts M.D.   On: 01/09/2015 12:09    CBC  Recent Labs Lab  01/08/15 1222 01/09/15 2304 01/10/15 0744  WBC 13.5* 8.5 8.0  HGB 13.4 11.5* 12.3*  HCT 40.7 33.0* 35.6*  PLT 262 209 211  MCV 96 93.0 93.7  MCH 31.6 32.4 32.4  MCHC 32.9 34.8 34.6  RDW 13.2 13.2 13.2  LYMPHSABS 1.0 1.6  --   MONOABS 0.7 0.8  --   EOSABS 0.0 0.1  --   BASOSABS 0.1 0.0  --     Chemistries   Recent Labs Lab 01/08/15 1222 01/09/15 0602 01/09/15 2304 01/10/15 0744  NA  --   --  142 140  K  --   --  3.8 3.9  CL  --   --  110 110  CO2 24 24 23 22   GLUCOSE  --   --  101* 94  BUN 22* 22* 19 18  CREATININE 1.03 0.96 0.96 0.92  CALCIUM 8.3* 8.5* 8.5 8.7  AST  --   --  21  --   ALT  --   --  20  --   ALKPHOS  --   --  45  --   BILITOT  --   --  0.8  --    ------------------------------------------------------------------------------------------------------------------ estimated creatinine clearance is 63 mL/min (by C-G formula based on Cr of 0.92). ------------------------------------------------------------------------------------------------------------------ No results for input(s): HGBA1C in the last 72 hours. ------------------------------------------------------------------------------------------------------------------  Recent Labs  01/09/15 2304  CHOL 149  HDL 42  LDLCALC 93  TRIG 68  CHOLHDL 3.5   ------------------------------------------------------------------------------------------------------------------ No results for input(s): TSH, T4TOTAL, T3FREE, THYROIDAB in the last 72 hours.  Invalid input(s): FREET3 ------------------------------------------------------------------------------------------------------------------ No results for input(s): VITAMINB12, FOLATE, FERRITIN, TIBC, IRON, RETICCTPCT in the last 72 hours.  Coagulation profile  Recent Labs Lab 01/09/15 2304  INR 1.18    No results for input(s): DDIMER in the last 72 hours.  Cardiac Enzymes No results for input(s): CKMB, TROPONINI, MYOGLOBIN in the last 168  hours.  Invalid input(s): CK ------------------------------------------------------------------------------------------------------------------ Invalid input(s): POCBNP   Recent Labs  01/10/15 0617 01/10/15 1116 01/10/15 1627 01/10/15 2307 01/11/15 0609 01/11/15 1120  GLUCAP 93 93 99 106* 110* 102*     Buck Mcaffee M.D. [facility] 01/11/2015, 1:46 PM  Pager: [phone]  After 7pm go to [email or web address] - password [email or web address]  Call night coverage person covering after 7pm

## 2015-01-11 NOTE — Discharge Summary (Addendum)
PATIENT NAME:  Jerome Bowen, Jerome Bowen MR#:  [id number] DATE OF BIRTH:  Dec 03, 1933  DATE OF ADMISSION:  01/08/2015 DATE OF DISCHARGE:  01/09/2015  PRIMARY CARE PHYSICIAN:  Dr. Dareen PianoAnderson.   CARDIOLOGIST:  Dr. Gwen PoundsKowalski.   FINAL DIAGNOSES:  1. Coronary artery disease.  2. Acute cerebrovascular accident.  3. Hypertension.  4. History of deep vein thrombosis.   CURRENT MEDICATIONS: Aspirin 1 daily, clopidogrel 75 mg daily.   PROCEDURES:  1.  Cardiac catheterization 01/08/2015.  2.  Percutaneous coronary intervention with coronary stent 01/08/2015.   HISTORY OF PRESENT ILLNESS: Please see admission H and P.   HOSPITAL COURSE: The patient underwent elective cardiac catheterization on 01/08/2015 for ongoing chest pain and abnormal stress test. Coronary arteriography revealed high-grade sequential stenosis in the distal right coronary artery. The patient underwent percutaneous coronary intervention receiving a 2.75 x 33 mm drug-eluting stent with an excellent angiographic result. Approximately 20-30 minutes following the procedure the patient was noted to be agitated. The patient was given midazolam injection for concern for adverse reaction to Versed. The patient only had minimal improvement and shows continued signs of agitation and expressive aphasia. Symptoms persisted to today when the patient has been much more alert and cooperative, but clearly experiencing expressive aphasia. The patient was seen by Dr. Pauletta BrownsYuriy Zeylikman for neurological consultation and MRI scan was performed which revealed nonhemorrhagic bilateral infarction of the brain stem, bilateral occipital lobe, and punctate lesion in the left parietal lobe and posterior right frontal lobe. [facility] management and potential rehabilitation. The patient currently is clinically and hemodynamically stable.    ____________________________ Marcina MillardAlexander Lasheika Ortloff,  MD ap:bu D: 01/09/2015 14:55:25 ET T: 01/09/2015 15:47:47 ET JOB#: [id number]  cc: Marcina MillardAlexander Demonte Dobratz, MD, <Dictator> Marcina MillardALEXANDER Kourtni Stineman MD ELECTRONICALLY SIGNED 01/10/2015 11:11

## 2015-01-11 NOTE — Progress Notes (Signed)
Brief Nutrition Note  Consult received for enteral/tube feeding initiation and management.  Adult Enteral Nutrition Protocol initiated. Full assessment to follow.  Admitting Dx: CVA   Labs:   Recent Labs Lab 01/09/15 0602 01/09/15 2304 01/10/15 0744  NA  --  142 140  K  --  3.8 3.9  CL  --  110 110  CO2 24 23 22   BUN 22* 19 18  CREATININE 0.96 0.96 0.92  CALCIUM 8.5* 8.5 8.7  GLUCOSE  --  101* 94    Tilda FrancoLindsey My [place], MS, [place], LDN Pager: [phone] After Hours Pager: [phone]

## 2015-01-11 NOTE — Progress Notes (Signed)
Physical Therapy Treatment Patient Details Name: Jerome Bowen MRN: [id number] DOB: 1934-04-10 Today's Date: 01/11/2015    History of Present Illness 81 yo male [facility] angiography undergoing PCI with drug eluting stent for RCA obstruction. MRI (+) acute bil infarcts brainstem bil occipital infarcts punctate L parietal lobe punctate posterior R frontal infarct  PMH: L eye CVA only use of Lower two quadrants,  CAD sleep apnea and HTN    PT Comments    Pt is highly motivated with a very supportive family and an excellent candidate for CIR.   Follow Up Recommendations  CIR;Supervision/Assistance - 24 hour     Equipment Recommendations  Rolling walker with 5" wheels    Recommendations for Other Services Rehab consult     Precautions / Restrictions Precautions Precautions: Fall;Other (comment) Precaution Comments: panda    Mobility  Bed Mobility         Supine to sit: Mod assist     General bed mobility comments: verbal/tactile cueing for sequencing  Transfers   Equipment used: 2 person hand held assist Transfers: Sit to/from Stand Sit to Stand: +2 physical assistance;Mod assist            Ambulation/Gait Ambulation/Gait assistance: +2 physical assistance;Mod assist Ambulation Distance (Feet): 5 Feet Assistive device: 2 person hand held assist Gait Pattern/deviations: Step-through pattern;Decreased stride length;Ataxic;Decreased weight shift to right   Gait velocity interpretation: Below normal speed for age/gender General Gait Details: decreased proprioception RLE requiring multi placement attempts and verbal cues when stepping with RLE. Knee buckling noted with WB on RLE.   Stairs            Wheelchair Mobility    Modified Rankin (Stroke Patients Only) Modified Rankin (Stroke Patients Only) Pre-Morbid Rankin Score: No symptoms Modified Rankin: Moderately severe disability     Balance   Sitting-balance support: Feet  supported;No upper extremity supported Sitting balance-Leahy Scale: Fair   Postural control: Right lateral lean Standing balance support: Bilateral upper extremity supported;During functional activity Standing balance-Leahy Scale: Poor                      Cognition Arousal/Alertness: Awake/alert Behavior During Therapy: WFL for tasks assessed/performed Overall Cognitive Status: Within Functional Limits for tasks assessed                      Exercises      General Comments        Pertinent Vitals/Pain Pain Assessment: No/denies pain    Home Living                      Prior Function            PT Goals (current goals can now be found in the care plan section) Progress towards PT goals: Progressing toward goals    Frequency  Min 4X/week    PT Plan Current plan remains appropriate    Co-evaluation             End of Session Equipment Utilized During Treatment: Gait belt Activity Tolerance: Patient tolerated treatment well Patient left: in chair;with family/visitor present;with call bell/phone within reach     Time: 1003-1019 PT Time Calculation (min) (ACUTE ONLY): 16 min  Charges:  $Therapeutic Activity: 8-22 mins                    G Codes:      Ilda FoilGarrow, Kaaliyah Kita Rene 01/11/2015, 11:06  AM

## 2015-01-11 NOTE — Progress Notes (Signed)
*  PRELIMINARY RESULTS* Echocardiogram 2D Echocardiogram has been performed.  Jerome Bowen, Jerome Bowen 01/11/2015, 4:11 PM

## 2015-01-11 NOTE — Consult Note (Addendum)
PATIENT NAME:  Jerome Bowen, Jerome Bowen DATE OF BIRTH:  04-07-1934  DATE OF CONSULTATION:  01/09/2015  REFERRING PHYSICIAN:   CONSULTING PHYSICIAN:  Pauletta BrownsYuriy Ilse Billman, MD  REASON FOR CONSULTATION:  Expressive aphasia.   HISTORY OF PRESENT ILLNESS:  A 81 year old gentleman with past medical history of hypertension, hyperlipidemia and angina, status post PCI and stent placement distal right coronary artery.  Post procedure, the patient was found to have an expressive Broca's type of aphasia that has not improved.  Status post a CAT scan of the head did not show any acute intracranial abnormalities. The patient still has expressive aphasia and difficulty swallowing.   Status post discussion with Bonita QuinLinda, the patient's wife yesterday, and discussed the case with daughter who is at bedside this morning.   NEUROLOGICAL EVALUATION:  The patient is able to follow simple commands,  nods his head appropriately. Extraocular movements intact. Visual fields intact. Tongue is midline. Uvula is symmetrical. Shoulder shrug intact. No facial asymmetry noted. Upper extremities are 4+ out of 5 bilaterally.  The lower extremities generalized weakness, 4 out of 5,  symmetrical.  Sensation intact to light touch and temperature. Coordination finger-to-nose intact. Gait could not be assessed.   IMPRESSION: A 81 year old gentleman status post percutaneous coronary intervention for angina with stent in distal right coronary artery with expressive aphasia and dysphagia. I strongly suspect the patient has a left frontal stroke likely in the superior MCA division providing some Broca's  type of aphasia.   PLAN: MRI of the brain is being done now.  Speech therapy is on board. I have discussed the case with them for both swallowing and speaking. Physical therapy, occupational therapy, continue aspirin. Will follow-up and discuss with family later today after the imaging. Thank you, it was a pleasure seeing this patient.     ____________________________ Pauletta BrownsYuriy Raiquan Chandler, MD yz:tr D: 01/09/2015 10:28:31 ET T: 01/09/2015 12:01:47 ET JOB#: [id number]  cc: Pauletta BrownsYuriy Olliver Boyadjian, MD, <Dictator> Pauletta BrownsYURIY Jahzara Slattery MD ELECTRONICALLY SIGNED 01/29/2015 17:14

## 2015-01-11 NOTE — Progress Notes (Signed)
Stroke Team Progress Note  HISTORY 81 y.o. male who underwent elective cardiac cath for chest pain with abnormal stress and had a distal RCA stenosis and had a DES placed. Following the procedure, he was noted to be agitated. He was given further sedation with suspicion for possible adverse reaction to his initial sedation. He was noted to have difficulty speaking and as he was less agitated, it became clear that the aphasia persisted. An MRI was obtained which shows multifocal infarcts in both the anterior and posterior circulation consistent with cardiac embolus.     SUBJECTIVE Speech much improved   OBJECTIVE Most recent Vital Signs: Filed Vitals:   01/10/15 2059 01/11/15 0122 01/11/15 0535 01/11/15 0917  BP: 133/65 174/98 160/72 150/72  Pulse: 78 84 78 81  Temp: 97.7 F (36.5 C) 99.2 F (37.3 C) 98.9 F (37.2 C) 99.5 F (37.5 C)  TempSrc: Oral Oral Axillary Axillary  Resp: SpO2: 98% 99% 98% 99%   CBG (last 3)   Recent Labs  01/10/15 1627 01/10/15 2307 01/11/15 0609  GLUCAP 99 106* 110*    IV Fluid Intake:   . sodium chloride 75 mL/hr at 01/11/15 0926  . feeding supplement (JEVITY 1.2 CAL)      MEDICATIONS  . antiseptic oral rinse  7 mL Mouth Rinse BID  . aspirin  300 mg Rectal Daily   Or  . aspirin  325 mg Oral Daily  . atorvastatin  10 mg Per Tube q1800  . clopidogrel  75 mg Per NG tube Daily  . famotidine (PEPCID) IV  20 mg Intravenous Q12H  . heparin  5,000 Units Subcutaneous 3 times per day   PRN:  acetaminophen, sodium chloride  Diet:  Diet NPO time specified  Activity:  Bedrest DVT Prophylaxis:  scd's heparin s/q  CLINICALLY SIGNIFICANT STUDIES Basic Metabolic Panel:   Recent Labs Lab 01/09/15 2304 01/10/15 0744  NA 142 140  K 3.8 3.9  CL 110 110  CO2 23 22  GLUCOSE 101* 94  BUN 19 18  CREATININE 0.96 0.92  CALCIUM 8.5 8.7   Liver Function Tests:   Recent Labs Lab 01/09/15 2304  AST 21  ALT 20  ALKPHOS 45  BILITOT  0.8  PROT 6.0  ALBUMIN 3.0*   CBC:   Recent Labs Lab 01/08/15 1222 01/09/15 2304 01/10/15 0744  WBC 13.5* 8.5 8.0  NEUTROABS 11.7* 6.1  --   HGB 13.4 11.5* 12.3*  HCT 40.7 33.0* 35.6*  MCV 96 93.0 93.7  PLT 262 209 211   Coagulation:   Recent Labs Lab 01/09/15 2304  LABPROT 15.1  INR 1.18   Cardiac Enzymes: No results for input(s): CKTOTAL, CKMB, CKMBINDEX, TROPONINI in the last 168 hours. Urinalysis: No results for input(s): COLORURINE, LABSPEC, PHURINE, GLUCOSEU, HGBUR, BILIRUBINUR, KETONESUR, PROTEINUR, UROBILINOGEN, NITRITE, LEUKOCYTESUR in the last 168 hours.  Invalid input(s): APPERANCEUR Lipid Panel    Component Value Date/Time   CHOL 149 01/09/2015 2304   TRIG 68 01/09/2015 2304   HDL 42 01/09/2015 2304   CHOLHDL 3.5 01/09/2015 2304   VLDL 14 01/09/2015 2304   LDLCALC 93 01/09/2015 2304   HgbA1C No results found for: HGBA1C  Urine Drug Screen:  No results found for: LABOPIA, COCAINSCRNUR, LABBENZ, AMPHETMU, THCU, LABBARB  Alcohol Level: No results for input(s): ETH in the last 168 hours.  Mr Angiogram Neck W Wo Contrast  01/10/2015   CLINICAL DATA:  Acute brainstem infarction.  EXAM: MRA NECK WITHOUT AND WITH  CONTRAST  MRA HEAD WITHOUT CONTRAST  TECHNIQUE: Multiplanar and multiecho pulse sequences of the neck were obtained without and with intravenous contrast. Angiographic images of the neck were obtained using MRA technique without and with intravenous contast.; Angiographic images of the Circle of Willis were obtained using MRA technique without intravenous contrast.  CONTRAST:  20mL MULTIHANCE GADOBENATE DIMEGLUMINE 529 MG/ML IV SOLN  COMPARISON:  MRI 01/09/2015.  Ultrasound 01/09/2015.  FINDINGS: MRA NECK FINDINGS  Branching pattern of the brachiocephalic vessels from the arch is normal. No origin stenoses.  Both common carotid arteries are widely patent to their respective bifurcation. Both carotid bifurcations are normal without narrowing or irregularity.  Both cervical internal carotid arteries are tortuous but widely patent an otherwise normal.  The right vertebral artery is patent at its origin. I think there is narrowing on the order of 30%. Beyond that, the vessel is widely patent through the cervical region.  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 There does not appear to be any reconstitution until the skullbase level where there is probably minor reconstitution of the distal left vertebral artery, supplying at least left PICA. There may be minimal reconstituted flow contributed to the basilar.  MRA HEAD FINDINGS  Both internal carotid arteries are widely patent through the skullbase. No siphon stenosis. The anterior and middle cerebral vessels are patent without proximal stenosis, aneurysm or vascular malformation. The right PCA takes a fetal origin from the anterior circulation. There is a large posterior communicating artery on the left, but with some communication with the distal basilar.  The right vertebral artery is a large vessel that supplies the basilar. No basilar stenosis. No antegrade flow is seen in the left vertebral artery, though this study is not as sensitive as information gleaned from the contrast-enhanced study, which showed some reconstitution of the distal left vertebral artery by cervical collaterals at the skullbase, supplying the left PICA and with minimal contribution to the basilar. Both superior cerebellar arteries show flow. As noted previously, the right PCA receives it supply from the anterior circulation. The left PCA receives it supply from both the basilar tip and the anterior circulation.  IMPRESSION: No anterior circulation disease identified.  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 Reconstitution of the distal left vertebral artery at the skullbase level, supplying left PICA and giving minimal contribution to the basilar.  No basilar stenosis. Superior cerebellar and posterior cerebral arteries show flow  presently.   Electronically Signed   By: Paulina Fusi M.D.   On: 01/10/2015 11:48   Dg Abd Portable 1v  01/10/2015   CLINICAL DATA:  Nasogastric tube placement.  Esophageal stricture.  EXAM: PORTABLE ABDOMEN - 1 VIEW  COMPARISON:  None.  FINDINGS: Feeding tube is identified within the proximal stomach. Mild motion degradation. Nonobstructive bowel gas pattern. Mid and lower pelvis excluded.  IMPRESSION: Feeding tube terminating at the proximal stomach.   Electronically Signed   By: Jeronimo Greaves M.D.   On: 01/10/2015 00:19   Dg Swallowing Func-speech Pathology  01/10/2015    Objective Swallowing Evaluation:    Patient Details  Name: Jerome Bowen MRN: [id number] Date of Birth: [birth year]  Today's Date: 01/10/2015 Time: SLP Start Time (ACUTE ONLY): 1430-SLP Stop Time (ACUTE ONLY): 1500 SLP Time Calculation (min) (ACUTE ONLY): 30 min  Past Medical History:  Past Medical History  Diagnosis Date  . Essential hypertension   . Dyslipidemia   . CAD (coronary artery disease)   . S/P right coronary artery (RCA)  stent placement   . OSA (obstructive sleep apnea)   . Esophageal stricture   . Seronegative arthritis   . Fatty liver    Past Surgical History: No past surgical history on file. HPI:  Other Pertinent Information: Tressia DanasJack L Sallas is a 81 y.o. male with past  medical history of hypertension, dyslipidemia, coronary artery disease,  obstructive sleep apnea on C Pap, seronegative arthritis. The patient was  br[facility] in [facility].  No Data Recorded  Assessment / Plan / Recommendation CHL IP CLINICAL IMPRESSIONS 01/10/2015  Therapy Diagnosis Severe oral phase dysphagia;Moderate pharyngeal phase  dysphagia  Clinical Impression MBSS completed.  Pt presents with severe oral and  moderate pharyngeal dysphagia c/b severe oral discoordination and poor a/p  transit of the bolus.  The pt was unable to adequately manipulate thins or  purees.  The entire thin bolus was anteriorly lost and the pt was unable  to move the puree  bolus into the pharynx requiring oral suctioning.   Pharyngeally the pt presented with a delayed swallow trigger, decreased  constriction with mild vallecular and pyriform residue and decreased  epiglottic deflection with tsp boluses of honey.  Given the pt's current  swallowing ability rx keep the pt NPO.  Rx therapeutic feeds of honey  liquids via tsp sips with the SLP only.  The pt will require intense ST  f/u at the next level of care.        CHL IP TREATMENT RECOMMENDATION 01/10/2015  Treatment Recommendations Therapy as outlined in treatment plan below     CHL IP DIET RECOMMENDATION 01/10/2015  SLP Diet Recommendations NPO  Liquid Administration via (None)  Medication Administration Via alternative means  Compensations (None)  Postural Changes and/or Swallow Maneuvers (None)     CHL IP OTHER RECOMMENDATIONS 01/10/2015  Recommended Consults (None)  Oral Care Recommendations Oral care BID  Other Recommendations (None)     CHL IP FOLLOW UP RECOMMENDATIONS 01/10/2015  Follow up Recommendations Inpatient Rehab     CHL IP FREQUENCY AND DURATION 01/10/2015  Speech Therapy Frequency (ACUTE ONLY) min 2x/week  Treatment Duration 2 weeks         CHL IP REASON FOR REFERRAL 01/10/2015  Reason for Referral Objectively evaluate swallowing function     CHL IP ORAL PHASE 01/10/2015  Lips (None)  Tongue (None)  Mucous membranes (None)  Nutritional status (None)  Other (None)  Oxygen therapy (None)  Oral Phase Impaired  Oral - Pudding Teaspoon (None)  Oral - Pudding Cup (None)  Oral - Honey Teaspoon (None)  Oral - Honey Cup (None)  Oral - Honey Syringe (None)  Oral - Nectar Teaspoon (None)  Oral - Nectar Cup (None)  Oral - Nectar Straw (None)  Oral - Nectar Syringe (None)  Oral - Ice Chips (None)  Oral - Thin Teaspoon (None)  Oral - Thin Cup (None)  Oral - Thin Straw (None)  Oral - Thin Syringe (None)  Oral - Puree (None)  Oral - Mechanical Soft (None)  Oral - Regular (None)  Oral - Multi-consistency (None)  Oral - Pill (None)  Oral  Phase - Comment (None)      CHL IP PHARYNGEAL PHASE 01/10/2015  Pharyngeal Phase Impaired  Pharyngeal - Pudding Teaspoon (None)  Penetration/Aspiration details (pudding teaspoon) (None)  Pharyngeal - Pudding Cup (None)  Penetration/Aspiration details (pudding cup) (None)  Pharyngeal - Honey Teaspoon (None)  Penetration/Aspiration details (honey teaspoon) (None)  Pharyngeal - Honey Cup (None)  Penetration/Aspiration details (honey cup) (None)  Pharyngeal -  Honey Syringe (None)  Penetration/Aspiration details (honey syringe) (None)  Pharyngeal - Nectar Teaspoon (None)  Penetration/Aspiration details (nectar teaspoon) (None)  Pharyngeal - Nectar Cup (None)  Penetration/Aspiration details (nectar cup) (None)  Pharyngeal - Nectar Straw (None)  Penetration/Aspiration details (nectar straw) (None)  Pharyngeal - Nectar Syringe (None)  Penetration/Aspiration details (nectar syringe) (None)  Pharyngeal - Ice Chips (None)  Penetration/Aspiration details (ice chips) (None)  Pharyngeal - Thin Teaspoon (None)  Penetration/Aspiration details (thin teaspoon) (None)  Pharyngeal - Thin Cup (None)  Penetration/Aspiration details (thin cup) (None)  Pharyngeal - Thin Straw (None)  Penetration/Aspiration details (thin straw) (None)  Pharyngeal - Thin Syringe (None)  Penetration/Aspiration details (thin syringe') (None)  Pharyngeal - Puree (None)  Penetration/Aspiration details (puree) (None)  Pharyngeal - Mechanical Soft (None)  Penetration/Aspiration details (mechanical soft) (None)  Pharyngeal - Regular (None)  Penetration/Aspiration details (regular) (None)  Pharyngeal - Multi-consistency (None)  Penetration/Aspiration details (multi-consistency) (None)  Pharyngeal - Pill (None)  Penetration/Aspiration details (pill) (None)  Pharyngeal Comment (None)             Fleet Contras 01/10/2015, 3:26 PM  Dimas Aguas, MA, CCC-SLP Acute Rehab SLP [id number]]     Mr Maxine Glenn Head/brain Wo Cm  01/10/2015   CLINICAL DATA:  Acute brainstem  infarction.  EXAM: MRA NECK WITHOUT AND WITH CONTRAST  MRA HEAD WITHOUT CONTRAST  TECHNIQUE: Multiplanar and multiecho pulse sequences of the neck were obtained without and with intravenous contrast. Angiographic images of the neck were obtained using MRA technique without and with intravenous contast.; Angiographic images of the Circle of Willis were obtained using MRA technique without intravenous contrast.  CONTRAST:  20mL MULTIHANCE GADOBENATE DIMEGLUMINE 529 MG/ML IV SOLN  COMPARISON:  MRI 01/09/2015.  Ultrasound 01/09/2015.  FINDINGS: MRA NECK FINDINGS  Branching pattern of the brachiocephalic vessels from the arch is normal. No origin stenoses.  Both common carotid arteries are widely patent to their respective bifurcation. Both carotid bifurcations are normal without narrowing or irregularity. Both cervical internal carotid arteries are tortuous but widely patent an otherwise normal.  The right vertebral artery is patent at its origin. I think there is narrowing on the order of 30%. Beyond that, the vessel is widely patent through the cervical region.  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 There does not appear to be any reconstitution until the skullbase level where there is probably minor reconstitution of the distal left vertebral artery, supplying at least left PICA. There may be minimal reconstituted flow contributed to the basilar.  MRA HEAD FINDINGS  Both internal carotid arteries are widely patent through the skullbase. No siphon stenosis. The anterior and middle cerebral vessels are patent without proximal stenosis, aneurysm or vascular malformation. The right PCA takes a fetal origin from the anterior circulation. There is a large posterior communicating artery on the left, but with some communication with the distal basilar.  The right vertebral artery is a large vessel that supplies the basilar. No basilar stenosis. No antegrade flow is seen in the left vertebral artery, though this  study is not as sensitive as information gleaned from the contrast-enhanced study, which showed some reconstitution of the distal left vertebral artery by cervical collaterals at the skullbase, supplying the left PICA and with minimal contribution to the basilar. Both superior cerebellar arteries show flow. As noted previously, the right PCA receives it supply from the anterior circulation. The left PCA receives it supply from both the basilar tip and the anterior circulation.  IMPRESSION: No anterior  circulation disease identified.  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 Reconstitution of the distal left vertebral artery at the skullbase level, supplying left PICA and giving minimal contribution to the basilar.  No basilar stenosis. Superior cerebellar and posterior cerebral arteries show flow presently.   Electronically Signed   By: Paulina Fusi M.D.   On: 01/10/2015 11:48   Mr Brain Ltd W/o Cm  01/09/2015   CLINICAL DATA:  Expressive a aphasia following placement of cardiac stent 01/08/2015  EXAM: MRI HEAD WITHOUT CONTRAST  TECHNIQUE: Multiplanar, multiecho pulse sequences of the brain and surrounding structures were obtained without intravenous contrast.  COMPARISON:  CT head without contrast 01/08/2015. Carotid ultrasound 01/09/2015.  FINDINGS: Bilateral brainstem infarcts are present. The largest areas in the inferior pons right left paramedian infarct extends over 2 cm anterior to posterior. Additional punctate foci of restricted diffusion are present within the occipital lobes bilaterally in the high left parietal lobe. There is a punctate area of focal infarct along the precentral sulcus on the right.  T2 changes are evident along the infarcts in the brainstem. Mild atrophy and periventricular white matter changes are chronic. No acute hemorrhage is evident.  Flow is present in the major intracranial arteries. There is a thick ram about a cystic lesion at floor of the left maxillary sinus. The  collection measures 17 mm maximally and appears P associated with the alveolar ridge. The paranasal sinuses and mastoid air cells are otherwise clear. Skullbase is unremarkable. Midline structures are normal.  IMPRESSION: 1. Acute nonhemorrhagic bilateral infarcts of the brainstem. 2. Acute nonhemorrhagic bilateral occipital lobe infarcts. 3. Acute nonhemorrhagic punctate left parietal lobe infarct. 4. Acute nonhemorrhagic punctate posterior right frontal lobe infarct along the precentral gyrus. 5. Mild atrophy and white matter disease likely reflects the sequela of chronic microvascular ischemia in addition to the acute infarcts. 6. 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 These results were called by telephone at the time of interpretation on 01/09/2015 at 12:08 pm to Dr. Arnoldo Hooker, who verbally acknowledged these results.   Electronically Signed   By: Marin Roberts M.D.   On: 01/09/2015 12:09    CT of the brain no acute abnormality   MRI of the brain  B/l emoblic strokes   MRA of the brain  No anterior or posterior circulation stenosis.   Carotid Doppler    2D Echocardiogram  Pending   CXR    EKG  normal EKG, normal sinus rhythm, unchanged from previous tracings. For complete results please see formal report.   Therapy Recommendations pending   Physical Exam Constitutional: Appears well-developed and well-nourished.  Psych: Affect appropriate to situation Eyes: No scleral injection HENT: No OP obstrucion Head: Normocephalic.  Cardiovascular: Normal rate and regular rhythm.  Respiratory: Effort normal and breath sounds normal to anterior ascultation GI: Soft. No distension. There is no tenderness.  Skin: WDI  Neuro: Mental Status: Patient is awake, alert, awake, interactive and appropriate Patient is able to contribute details to the history.  No signs of neglect.  Aphasia is difficult to assess given difficulty with writing due to ataxia and severe dysarthria. I suspect that he does not have any.  Cranial Nerves: II: Visual Fields are full. Pupils are equal, round, and reactive to light.  III,IV, VI: He is unable to cross midlien to the left, his right eye is abel to abduct but his left eye  does not follow. He is able to look up and down with both eyes.  V: Facial sensation is symmetric to temperature VII: Facial movement is symmetric.  VIII: hearing is intact to voice Motor: Tone is normal. Bulk is normal. 5/5 strength was present on the left, he has 4/5 strength in the right arm and leg.  Sensory: Sensation is decreased to temp in the right arm, symmetric in the leg.  Deep Tendon Reflexes: 2+ and symmetric in the biceps and patellae.  Cerebellar: FNF and HKS are intact on the left, ataxic on the right.   ASSESSMENT  Mr. AKIN YI is a 81 y.o. male s/p cath and then stent placement. Followed by cath generalized weakness with expressive aphasia. MRI b/l strokes in setting of cath.    Unable to swallow in [place].      Hospital day # 2  TREATMENT/PLAN   Mental status appears to have improved. Speech is more clear this AM  Dual anti plt  con't Pt/Ot  Speech f/up. If unable to swallow by mid next week, will need PEG placement. This was discussed with pt and family   CIR placement next week.       SIGNED Pauletta Browns    To contact Stroke Continuity provider, please refer to [email or web address]. After hours, contact General Neurology

## 2015-01-11 NOTE — Progress Notes (Signed)
H and P indicates echo completed at [place], however, other notes indicate not done. RN checking with medical records at [facility].

## 2015-01-12 ENCOUNTER — Encounter (HOSPITAL_COMMUNITY): Payer: Self-pay | Admitting: General Practice

## 2015-01-12 DIAGNOSIS — I69359 Hemiplegia and hemiparesis following cerebral infarction affecting unspecified side: Secondary | ICD-10-CM

## 2015-01-12 DIAGNOSIS — I6319 Cerebral infarction due to embolism of other precerebral artery: Secondary | ICD-10-CM

## 2015-01-12 DIAGNOSIS — I6340 Cerebral infarction due to embolism of unspecified cerebral artery: Secondary | ICD-10-CM

## 2015-01-12 LAB — GLUCOSE, CAPILLARY
GLUCOSE-CAPILLARY: 111 mg/dL — AB (ref 70–99)
GLUCOSE-CAPILLARY: 113 mg/dL — AB (ref 70–99)
GLUCOSE-CAPILLARY: 145 mg/dL — AB (ref 70–99)
Glucose-Capillary: 143 mg/dL — ABNORMAL HIGH (ref 70–99)
Glucose-Capillary: 142 mg/dL — ABNORMAL HIGH (ref 70–99)
Glucose-Capillary: 134 mg/dL — ABNORMAL HIGH (ref 70–99)

## 2015-01-12 LAB — HEMOGLOBIN A1C
Hgb A1c MFr Bld: 5.4 % (ref 4.8–5.6)
MEAN PLASMA GLUCOSE: 108 mg/dL

## 2015-01-12 MED ORDER — FREE WATER
110.0000 mL | Status: DC
  Administered 2015-01-12 – 2015-01-13 (×6): 110 mL

## 2015-01-12 MED ORDER — JEVITY 1.2 CAL PO LIQD
1000.0000 mL | ORAL | Status: DC
  Administered 2015-01-12: 1000 mL
  Administered 2015-01-13: 04:00:00
  Filled 2015-01-12 (×3): qty 1000
  Filled 2015-01-12: qty 237
  Filled 2015-01-12: qty 1000
  Filled 2015-01-12: qty 237

## 2015-01-12 NOTE — Progress Notes (Signed)
Pt has home CPAP. Pt is able to put himself on. Pt understands to call with questions/concerns. RT will monitor.

## 2015-01-12 NOTE — Consult Note (Signed)
Physical Medicine and Rehabilitation Consult Reason for Consult: Acute bilateral infarcts brainstem, bilateral occipital lobe, left parietal lobe Referring Physician: [facility]   HPI: Jerome Bowen is a 81 y.o. right handed male with history of hypertension, hyperlipidemia and angina. Patient underwent elective cardiac cath for chest pain with abnormal stress test findings of distal RCA stenosis underwent stenting 01/08/2015 at [facility]. Patient independent prior to admission living with his wife in [place]. Following the procedure noted to be agitated as well as difficulty speaking. MRI obtained which showed multifocal infarcts in both anterior and posterior circulation consistent with cardiac embolus transferred to [facility]. Patient also had developed postoperative hematoma after cardiac catheterization. Did not receive TPA. Echocardiogram with ejection fraction of 70% systolic function was vigorous. MRI of the brain without stenosis. Neurology consulted presently maintained on aspirin and Plavix therapy. Septae separate for DVT prophylaxis. Patient is nothing by mouth with nasogastric tube feeds after a failed modified barium swallow. Physical therapy evaluation completed with recommendations of physical medicine rehabilitation consult.   Review of Systems  Eyes: Positive for blurred vision.  Respiratory: Positive for shortness of breath.   Cardiovascular: Positive for chest pain.  Genitourinary: Positive for urgency.  All other systems reviewed and are negative.  Past Medical History  Diagnosis Date  . Essential hypertension   . Dyslipidemia   . CAD (coronary artery disease)   . S/P right coronary artery (RCA) stent placement   . OSA (obstructive sleep apnea)   . Esophageal stricture   . Seronegative arthritis   . Fatty liver    Past Surgical History  Procedure Laterality Date  . Cardiac catheterization    . Colonoscopy      Family History  Problem Relation Age of Onset  . Heart disease Brother    Social History:  reports that he has quit smoking. His smoking use included Cigarettes. He has quit using smokeless tobacco. He reports that he drinks alcohol. He reports that he does not use illicit drugs. Allergies: No Known Allergies Medications Prior to Admission  Medication Sig Dispense Refill  . aspirin EC 325 MG tablet Take 325 mg by mouth daily.    . cholecalciferol (VITAMIN D) 1000 UNITS tablet Take 1,000 Units by mouth daily.    . clopidogrel (PLAVIX) 75 MG tablet Take 75 mg by mouth daily.    . Cyanocobalamin 1000 MCG TBCR Take 1 tablet by mouth daily.    Marland Kitchen losartan (COZAAR) 100 MG tablet Take 100 mg by mouth daily.    . tamsulosin (FLOMAX) 0.4 MG CAPS capsule Take 0.4 mg by mouth daily.     . vitamin E 1000 UNIT capsule Take 1,000 Units by mouth daily.      Home: Home Living Family/patient expects to be discharged to:: Private residence Living Arrangements: Spouse/significant other Available Help at Discharge: Family Type of Home: House Home Access: Stairs to enter Secretary/administrator of Steps: 2 Entrance Stairs-Rails: Right Home Layout: Two level, Able to live on main level with bedroom/bathroom Home Equipment: None  Lives With: Spouse  Functional History: Prior Function Level of Independence: Independent Functional Status:  Mobility: Bed Mobility Overal bed mobility: Needs Assistance, +2 for physical assistance Bed Mobility: Supine to Sit, Sit to Supine, Rolling Rolling: Min assist Supine to sit: Mod assist Sit to supine: +2 for physical assistance, Min assist General bed mobility comments: in chair on arrival. Wife and pt report extended time in chair for comfort with  secretions Transfers Overall transfer level: Needs assistance Equipment used: Rolling walker (2 wheeled), 2 person hand held assist Transfers: Sit to/from Stand Sit to Stand: +2 physical assistance, Mod  assist General transfer comment: performed x3, verbal cues for hand placement Ambulation/Gait Ambulation/Gait assistance: Mod assist, +2 physical assistance (1 person max assist with progression through gait training) Ambulation Distance (Feet): 50 Feet (X2, with increments of 10- 20 ft of tactile gait retraining) Assistive device: 2 person hand held assist Adult nurse with progression of gait) General Gait Details: +2 physical assist for gait training, decreased proprioception for RLE stride placement, improvements noted with facilitation of upright posture and tactile cues for weight shift to the left upon initiation of RLE advancement. RLE at times hyperextending, cues for positional control with stop/restart and midline cueing. Gait Pattern/deviations: Step-through pattern, Decreased stride length, Ataxic, Decreased weight shift to right Gait velocity interpretation: Below normal speed for age/gender    ADL: ADL Overall ADL's : Needs assistance/impaired Eating/Feeding: NPO Eating/Feeding Details (indicate cue type and reason): pt wanting ice chips like at [facility] Grooming: Oral care, Wash/dry face, Moderate assistance, Sitting Grooming Details (indicate cue type and reason): noted R ue weakness decr grasp, attempting to wipe drool from poor lip closure Upper Body Bathing: Moderate assistance, Bed level Lower Body Bathing: Total assistance Toilet Transfer: +2 for physical assistance, Moderate assistance Functional mobility during ADLs: +2 for physical assistance, Moderate assistance General ADL Comments: Pt very motivated to ambulate and get progressing with therapy on arrival.pt wants ice chips for oral trials. Pt and family educated taht SLP will arrive today.   Cognition: Cognition Overall Cognitive Status: Within Functional Limits for tasks assessed Orientation Level: Oriented X4 Cognition Arousal/Alertness: Awake/alert Behavior During Therapy: WFL for tasks  assessed/performed Overall Cognitive Status: Within Functional Limits for tasks assessed  Blood pressure 135/70, pulse 78, temperature 99 F (37.2 C), temperature source Oral, resp. rate 18, weight 79.3 kg (174 lb 13.2 oz), SpO2 97 %. Physical Exam  Vitals reviewed. Constitutional:  81 year old right handed Caucasian male.  HENT:  Mild right facial droop  Eyes:  Pupils reactive to light without nystagmus  Neck: Normal range of motion. Neck supple. No thyromegaly present.  Cardiovascular: Normal rate and regular rhythm.   Respiratory:  Limited inspiratory effort but clear to auscultation  GI: Soft. Bowel sounds are normal. He exhibits no distension.  Neurological: He is alert.  Speech is severely dysarthric but intelligible. He does follow simple commands. Limited vision to left hemisphere.  He is able to provide his name age as well as date of birth. RUE 3+/5. RLE 3+ to 4/5. LUE 4/5. LLE 4- to 4/5. Decreased LT RUE.   Skin: Skin is warm and dry.  Psychiatric:  Pleasant, cooperative    Results for orders placed or performed during the hospital encounter of 01/09/15 (from the past 24 hour(s))  Glucose, capillary     Status: Abnormal   Collection Time: 01/11/15  4:28 PM  Result Value Ref Range   Glucose-Capillary 109 (H) 70 - 99 mg/dL  Glucose, capillary     Status: Abnormal   Collection Time: 01/11/15 11:35 PM  Result Value Ref Range   Glucose-Capillary 111 (H) 70 - 99 mg/dL   Comment 1 Notify RN    Comment 2 Document in Chart   Glucose, capillary     Status: Abnormal   Collection Time: 01/12/15  4:02 AM  Result Value Ref Range   Glucose-Capillary 143 (H) 70 - 99 mg/dL   Comment 1  Notify RN    Comment 2 Document in Chart   Glucose, capillary     Status: Abnormal   Collection Time: 01/12/15  7:22 AM  Result Value Ref Range   Glucose-Capillary 142 (H) 70 - 99 mg/dL   Comment 1 Document in Chart    Dg Swallowing Func-speech Pathology  01/10/2015    Objective Swallowing  Evaluation:    Patient Details  Name: ALBY SCHWABE MRN: [id number] Date of Birth: [birth year]  Today's Date: 01/10/2015 Time: SLP Start Time (ACUTE ONLY): 1430-SLP Stop Time (ACUTE ONLY): 1500 SLP Time Calculation (min) (ACUTE ONLY): 30 min  Past Medical History:  Past Medical History  Diagnosis Date  . Essential hypertension   . Dyslipidemia   . CAD (coronary artery disease)   . S/P right coronary artery (RCA) stent placement   . OSA (obstructive sleep apnea)   . Esophageal stricture   . Seronegative arthritis   . Fatty liver    Past Surgical History: No past surgical history on file. HPI:  Other Pertinent Information: BRANDIS MATSUURA is a 81 y.o. male with past  medical history of hypertension, dyslipidemia, coronary artery disease,  obstructive sleep apnea on C Pap, seronegative arthritis. The patient was  brought in from [facility].  No Data Recorded  Assessment / Plan / Recommendation CHL IP CLINICAL IMPRESSIONS 01/10/2015  Therapy Diagnosis Severe oral phase dysphagia;Moderate pharyngeal phase  dysphagia  Clinical Impression MBSS completed.  Pt presents with severe oral and  moderate pharyngeal dysphagia c/b severe oral discoordination and poor a/p  transit of the bolus.  The pt was unable to adequately manipulate thins or  purees.  The entire thin bolus was anteriorly lost and the pt was unable  to move the puree bolus into the pharynx requiring oral suctioning.   Pharyngeally the pt presented with a delayed swallow trigger, decreased  constriction with mild vallecular and pyriform residue and decreased  epiglottic deflection with tsp boluses of honey.  Given the pt's current  swallowing ability rx keep the pt NPO.  Rx therapeutic feeds of honey  liquids via tsp sips with the SLP only.  The pt will require intense ST  f/u at the next level of care.        CHL IP TREATMENT RECOMMENDATION 01/10/2015  Treatment Recommendations Therapy as outlined in treatment plan below     CHL IP DIET RECOMMENDATION 01/10/2015   SLP Diet Recommendations NPO  Liquid Administration via (None)  Medication Administration Via alternative means  Compensations (None)  Postural Changes and/or Swallow Maneuvers (None)     CHL IP OTHER RECOMMENDATIONS 01/10/2015  Recommended Consults (None)  Oral Care Recommendations Oral care BID  Other Recommendations (None)     CHL IP FOLLOW UP RECOMMENDATIONS 01/10/2015  Follow up Recommendations Inpatient Rehab     CHL IP FREQUENCY AND DURATION 01/10/2015  Speech Therapy Frequency (ACUTE ONLY) min 2x/week  Treatment Duration 2 weeks         CHL IP REASON FOR REFERRAL 01/10/2015  Reason for Referral Objectively evaluate swallowing function     CHL IP ORAL PHASE 01/10/2015  Lips (None)  Tongue (None)  Mucous membranes (None)  Nutritional status (None)  Other (None)  Oxygen therapy (None)  Oral Phase Impaired  Oral - Pudding Teaspoon (None)  Oral - Pudding Cup (None)  Oral - Honey Teaspoon (None)  Oral - Honey Cup (None)  Oral - Honey Syringe (None)  Oral - Nectar Teaspoon (None)  Oral - Nectar Cup (None)  Oral - Nectar Straw (None)  Oral - Nectar Syringe (None)  Oral - Ice Chips (None)  Oral - Thin Teaspoon (None)  Oral - Thin Cup (None)  Oral - Thin Straw (None)  Oral - Thin Syringe (None)  Oral - Puree (None)  Oral - Mechanical Soft (None)  Oral - Regular (None)  Oral - Multi-consistency (None)  Oral - Pill (None)  Oral Phase - Comment (None)      CHL IP PHARYNGEAL PHASE 01/10/2015  Pharyngeal Phase Impaired  Pharyngeal - Pudding Teaspoon (None)  Penetration/Aspiration details (pudding teaspoon) (None)  Pharyngeal - Pudding Cup (None)  Penetration/Aspiration details (pudding cup) (None)  Pharyngeal - Honey Teaspoon (None)  Penetration/Aspiration details (honey teaspoon) (None)  Pharyngeal - Honey Cup (None)  Penetration/Aspiration details (honey cup) (None)  Pharyngeal - Honey Syringe (None)  Penetration/Aspiration details (honey syringe) (None)  Pharyngeal - Nectar Teaspoon (None)  Penetration/Aspiration details  (nectar teaspoon) (None)  Pharyngeal - Nectar Cup (None)  Penetration/Aspiration details (nectar cup) (None)  Pharyngeal - Nectar Straw (None)  Penetration/Aspiration details (nectar straw) (None)  Pharyngeal - Nectar Syringe (None)  Penetration/Aspiration details (nectar syringe) (None)  Pharyngeal - Ice Chips (None)  Penetration/Aspiration details (ice chips) (None)  Pharyngeal - Thin Teaspoon (None)  Penetration/Aspiration details (thin teaspoon) (None)  Pharyngeal - Thin Cup (None)  Penetration/Aspiration details (thin cup) (None)  Pharyngeal - Thin Straw (None)  Penetration/Aspiration details (thin straw) (None)  Pharyngeal - Thin Syringe (None)  Penetration/Aspiration details (thin syringe') (None)  Pharyngeal - Puree (None)  Penetration/Aspiration details (puree) (None)  Pharyngeal - Mechanical Soft (None)  Penetration/Aspiration details (mechanical soft) (None)  Pharyngeal - Regular (None)  Penetration/Aspiration details (regular) (None)  Pharyngeal - Multi-consistency (None)  Penetration/Aspiration details (multi-consistency) (None)  Pharyngeal - Pill (None)  Penetration/Aspiration details (pill) (None)  Pharyngeal Comment (None)             Fleet Contras 01/10/2015, 3:26 PM  Dimas Aguas, MA, CCC-SLP Acute Rehab SLP [phone]]      Assessment/Plan: Diagnosis: cardio-embolic cva 1. Does the need for close, 24 hr/day medical supervision in concert with the patient's rehab needs make it unreasonable for this patient to be served in a less intensive setting? Yes 2. Co-Morbidities requiring supervision/potential complications: cad, esophageal stricture 3. Due to bladder management, bowel management, safety, skin/wound care, disease management, medication administration, pain management and patient education, does the patient require 24 hr/day rehab nursing? Yes 4. Does the patient require coordinated care of a physician, rehab nurse, PT (1-2 hrs/day, 5 days/week), OT (1-2 hrs/day, 5 days/week) and  SLP (1-2 hrs/day, 5 days/week) to address physical and functional deficits in the context of the above medical diagnosis(es)? Yes Addressing deficits in the following areas: balance, endurance, locomotion, strength, transferring, bowel/bladder control, bathing, dressing, feeding, grooming, toileting, speech, swallowing and psychosocial support 5. Can the patient actively participate in an intensive therapy program of at least 3 hrs of therapy per day at least 5 days per week? Yes 6. The potential for patient to make measurable gains while on inpatient rehab is excellent 7. Anticipated functional outcomes upon discharge from inpatient rehab are modified independent and supervision  with PT, modified independent and supervision with OT, supervision and min assist with SLP. 8. Estimated rehab length of stay to reach the above functional goals is: 10-16 days 9. Does the patient have adequate social supports and living environment to accommodate these discharge functional goals? Yes 10. Anticipated D/C setting: Home 11. Anticipated post D/C treatments: HH therapy and  Outpatient therapy 12. Overall Rehab/Functional Prognosis: excellent  RECOMMENDATIONS: This patient's condition is appropriate for continued rehabilitative care in the following setting: CIR Patient has agreed to participate in recommended program. Yes Note that insurance prior authorization may be required for reimbursement for recommended care.  Comment: Rehab Admissions Coordinator to follow up.  Thanks,  Ranelle OysterZachary T. Nayali Talerico, MD, [place] DomFAAPMR     01/12/2015

## 2015-01-12 NOTE — Progress Notes (Signed)
I will begin [employer] medic[employer] insurance approval for a possible inpt rehab admission pending approval and bed availability. I will follow up with pt and family tomorrow. [phone]

## 2015-01-12 NOTE — Progress Notes (Signed)
CARE MANAGEMENT NOTE 01/12/2015  Patient:  Jerome Bowen,Jerome Bowen   Account Number:  [id number]  Date Initiated:  01/12/2015  Documentation initiated by:  Lasha Echeverria  Subjective/Objective Assessment:   Patient was admitted with CVA.  Lives at home with spouse.     Action/Plan:   Will follow for discharge needs pending PT/OT evals any physician orders.   Anticipated DC Date:     Anticipated DC Plan:  IP REHAB FACILITY         Choice offered to / List presented to:             Status of service:  In process, will continue to follow Medicare Important Message given?  YES (If response is "NO", the following Medicare IM given date fields will be blank) Date Medicare IM given:  01/12/2015 Medicare IM given by:  Elmer BalesOBARGE,Kelly Ranieri Date Additional Medicare IM given:   Additional Medicare IM given by:    Discharge Disposition:    Per UR Regulation:  Reviewed for med. necessity/level of care/duration of stay  If discussed at Long Length of Stay Meetings, dates discussed:    Comments:  01/12/15 0955 Elmer Balesourtney Shavonn Convey RN, MSN, CM- Medicare IM letter provided.

## 2015-01-12 NOTE — Progress Notes (Signed)
Initial Nutrition Assessment  INTERVENTION:  Tube feeding: Increase to rate of Jevity 1.2 to 60 ml/hr and increase by 10 ml every 4 hours to goal rate of 70 ml/hr.   Tube feeding regimen provides 2016 kcal (100% of needs), 93 grams of protein, and 1360 ml of H2O.   Provide 110 ml free water flushes every 4 hours to provide an additional 660 ml of water daily for a total water intake of 2020 ml daily.    NUTRITION DIAGNOSIS:  Inadequate oral intake related to inability to eat as evidenced by NPO status.   GOAL:  Patient will meet greater than or equal to 90% of their needs  MONITOR:  TF tolerance, Labs, Weight trends, Diet advancement  REASON FOR ASSESSMENT:  Consult Enteral/tube feeding initiation and management  ASSESSMENT: 81 y.o. male with Past medical history of hypertension, dyslipidemia, coronary artery disease, obstructive sleep apnea on C Pap, seronegative arthritis. The patient was recently hospitalized for elective angiography after an abnormal stress test. During the procedure the patient become agitated and was acting confused. Due to persistent aphasia and confusion and MRI was up pain which was positive for multifocal strokes.  Pt coughing at time of visit, panda tube slightly out of place; RN aware. Pt unable to provide much history at this time. Family at bedside report that pt was eating well/normally PTA. Pt is currently receiving Jevity 1.2 via NGT @ 50 ml/hr and tolerating well; 0-10 ml residuals per nursing notes.  Labs reviewed.   Height:  Ht Readings from Last 1 Encounters:  01/09/15 5\' 8"  (1.727 m)    Weight:  Wt Readings from Last 1 Encounters:  01/11/15 174 lb 13.2 oz (79.3 kg)    Ideal Body Weight:  70 kg  Wt Readings from Last 10 Encounters:  01/11/15 174 lb 13.2 oz (79.3 kg)  01/09/15 179 lb 14.4 oz (81.602 kg)    BMI:  Body mass index is 26.59 kg/(m^2).  Estimated Nutritional Needs:  Kcal:   1900-2100  Protein:    95-110  grams  Fluid:   1.9-2.1 L/day  Skin:  Reviewed, no issues  Diet Order:  Diet NPO time specified  EDUCATION NEEDS:  No education needs identified at this time  No intake or output data in the 24 hours ending 01/12/15 1307  Last BM:  4/30   Ian Malkineanne Barnett RD, LDN Inpatient Clinical Dietitian Pager: [phone] After Hours Pager: [phone]

## 2015-01-12 NOTE — Progress Notes (Signed)
Physical Therapy Treatment Patient Details Name: Jerome DanasJack L Paquette MRN: [id number] DOB: Jan 16, 1934 Today's Date: 01/12/2015    History of Present Illness 81 yo male [facility] after elective angiography undergoing PCI with drug eluting stent for RCA obstruction. MRI (+) acute bil infarcts brainstem bil occipital infarcts punctate L parietal lobe punctate posterior R frontal infarct  PMH: L eye CVA only use of Lower two quadrants,  CAD sleep apnea and HTN    PT Comments    Patient very motivated and determined during session today. Focus of session on gait retraining with functional tasks. Patient requires +2 physical assist for facilitation of gait mechanics and safety. Patient demonstrates ability to incorporate strategies to improve postural alignment and gait. Family supportive and involved throughout session. At this time, highly recommend CIR to maximize functional recovery with goal of potential independence with aspects of mobility.   Follow Up Recommendations  CIR;Supervision/Assistance - 24 hour     Equipment Recommendations  Rolling walker with 5" wheels    Recommendations for Other Services Rehab consult     Precautions / Restrictions Precautions Precautions: Fall Precaution Comments: panda Restrictions Weight Bearing Restrictions: No    Mobility  Bed Mobility               General bed mobility comments: in chair on arrival. Wife and pt report extended time in chair for comfort with secretions  Transfers Overall transfer level: Needs assistance Equipment used: Rolling walker (2 wheeled);2 person hand held assist Transfers: Sit to/from Stand Sit to Stand: +2 physical assistance;Mod assist         General transfer comment: performed x3, verbal cues for hand placement  Ambulation/Gait Ambulation/Gait assistance: Mod assist;+2 physical assistance (1 person max assist with progression through gait training) Ambulation Distance (Feet): 50 Feet (X2, with  increments of 10- 20 ft of tactile gait retraining) Assistive device: 2 person hand held assist Adult nurse(Rolling Walker with progression of gait) Gait Pattern/deviations: Step-through pattern;Decreased stride length;Ataxic;Decreased weight shift to right   Gait velocity interpretation: Below normal speed for age/gender General Gait Details: +2 physical assist for gait training, decreased proprioception for RLE stride placement, improvements noted with facilitation of upright posture and tactile cues for weight shift to the left upon initiation of RLE advancement. RLE at times hyperextending, cues for positional control with stop/restart and midline cueing.   Stairs            Wheelchair Mobility    Modified Rankin (Stroke Patients Only)       Balance   Sitting-balance support: Feet supported Sitting balance-Leahy Scale: Fair   Postural control: Right lateral lean Standing balance support: Bilateral upper extremity supported Standing balance-Leahy Scale: Poor                      Cognition Arousal/Alertness: Awake/alert Behavior During Therapy: WFL for tasks assessed/performed Overall Cognitive Status: Within Functional Limits for tasks assessed                      Exercises      General Comments General comments (skin integrity, edema, etc.): educated on manual weight shifts with bilateral UE support      Pertinent Vitals/Pain Pain Assessment: No/denies pain    Home Living Family/patient expects to be discharged to:: Private residence Living Arrangements: Spouse/significant other Available Help at Discharge: Family Type of Home: House              Prior Function  PT Goals (current goals can now be found in the care plan section) Acute Rehab PT Goals Patient Stated Goal: to get better PT Goal Formulation: With patient Time For Goal Achievement: 01/24/15 Potential to Achieve Goals: Good Progress towards PT goals: Progressing toward  goals    Frequency  Min 4X/week    PT Plan Current plan remains appropriate    Co-evaluation PT/OT/SLP Co-Evaluation/Treatment: Yes Reason for Co-Treatment: Complexity of the patient's impairments (multi-system involvement);For patient/therapist safety         End of Session Equipment Utilized During Treatment: Gait belt Activity Tolerance: Patient tolerated treatment well Patient left: in chair;with family/visitor present;with call bell/phone within reach     Time: 0911-0939 PT Time Calculation (min) (ACUTE ONLY): 28 min  Charges:  $Gait Training: 8-22 mins                    G CodesFabio Asa 27-Jan-2015, 10:48 AM Charlotte Crumb, PT DPT  [phone]

## 2015-01-12 NOTE — Progress Notes (Signed)
Speech Language Pathology Treatment: Dysphagia;Cognitive-Linquistic  Patient Details Name: Jerome Bowen MRN: [id number] DOB: 1933-10-30 Today's Date: 01/12/2015 Time: [name] SLP Time Calculation (min) (ACUTE ONLY): 39 min  Assessment / Plan / Recommendation Clinical Impression  Pt shows mild improvements with oral control of boluses today, as evidence by reduced anterior loss with thin liquid and ice chip boluses. While there is still significant labial spillage, he is able to swallow some of the thin liquid which is followed by immediate coughing and wet vocal quality, concerning for premature spillage and airway compromise. Wet vocal quality and delayed throat clearing is noted with spoonfuls of honey thick liquids.   Family reports that pt's speech is improved as compared to last SLP visit. Today, he requires Mod cues from SLP for slowed rate of speech which also facilitates increased pausing between words and over-articulation to maximize intelligibility. Pt shares that he has been completing lingual exercises, which he performed for this therapist with Min cues. Encouraged pt to continue with these exercises in between therapy sessions.   Overall, pt appears to have mild improvements in oral function that are beneficial for both swallowing and speech. Given continued difficulties with swallowing and overt signs concerning for aspiration, recommend to maintain NPO at this time with focus on oral care. Jerome Bowen is also in place for nutrition and medication administration (RN notified that tube appeared to be pulled as pt was wiping his nose). Will continue to follow for readiness to repeat objective testing and to provide therapeutic PO trials with SLP. Pt is motivated, has great family support, and will be a great candidate for CIR.   HPI Other Pertinent Information: Jerome Bowen is a 81 y.o. male with past medical history of hypertension, dyslipidemia, coronary artery disease, obstructive sleep  apnea on C Pap, seronegative arthritis. [facility].   Pertinent Vitals Pain Assessment: No/denies pain  SLP Plan  Continue with current plan of care    Recommendations Diet recommendations: NPO (pt with PANDA) Medication Administration: Via alternative means       Oral Care Recommendations: Oral care QID Follow up Recommendations: Inpatient Rehab Plan: Continue with current plan of care    Maxcine HamLaura Paiewonsky, M.A. CCC-SLP [phone]  [name], Jerome Bowen 01/12/2015, 12:06 PM

## 2015-01-12 NOTE — Progress Notes (Signed)
UR complete.  Jazlynn Nemetz RN, MSN 

## 2015-01-12 NOTE — Progress Notes (Signed)
[facility]                                                                              Patient Demographics  Jerome Bowen, is a 81 y.o. male, DOB - [birth year], ZOX:[id number]  Admit date - 01/09/2015   Admitting Physician Joseph Art, DO  Outpatient Primary MD for the patient is No PCP Per Patient  LOS - 3   No chief complaint on file.     HPI on 01/09/2015 by Dr. Lynden Oxford Jerome Bowen is a 81 y.o. male with Past medical history of hypertension, dyslipidemia, coronary artery disease, obstructive sleep apnea on C Pap, seronegative arthritis. The patient was brought in from [facility]. The patient was recently hospitalized for elective angiography after an abnormal stress test. The patient was having progressively worsening chest pain prior to the stress test. The angiography showed that the patient had an RCA obstruction and patient underwent PCI with drug-eluting stent. During the procedure the patient become agitated and was acting confused. Initially it was thought that the patient had a reaction to Versed. Patient also developed a small hematoma at the groin. Patient was placed on tirofiban overnight per protocol. A CT of the head initially did not reveal any acute abnormality. Due to persistent aphasia and confusion and MRI was up pain which was positive for multifocal strokes. Carotid Doppler as well as echo program was obtained and the patient was sent here for further workup. The patient is coming from hospital. And at his baseline independent for most of his ADL.  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 Reconstitution of left vertebral artery at the skull base supplying left PICA and giving minimal contribution to basilar-no other stenosis noted -2-D echo: EF 65-70% -Carotid Doppler showed very minimal amount of bilateral intimal wall thickening and atherosclerotic plaque right relatively greater than left but no hemodynamically significant stenosis -PT, OT recommending CIR-consult has been placed -Currently on aspirin and Plavix  -A1c 5.4, LDL 93  Dysphagia -Secondary to CVA -Currently NPO, failed swallow testing, has PANDA tube- have requested dietitian consult to start tube feeds via Panda tube -Neurology recommending to wait another 2-3 days to see if there is improvement in swallowing-if not, a PEG tube will need to be placed prior to discharge from the hospital -Once tube feeds have been advanced to maximum rate, IV fluids can be discontinued  CAD with recent RCA drug-eluting stent -Continue with aspirin and Plavix  Essential hypertension -Stable -Losartan initially held due to CVA  Dyslipidemia - LDL 93 placed on Lipitor  Code Status: Full  Family Communication: Wife at bedside  Disposition Plan: Admitted.  Pending CIR   Time Spent in minutes   30 minutes  Procedures  Echocardiogram Carotid doppler  Consults   Neurology  DVT Prophylaxis  Heparin  Lab Results  Component Value Date  PLT 211 01/10/2015    Medications  Scheduled Meds: . antiseptic oral rinse  7 mL Mouth Rinse BID  . aspirin  300 mg Rectal Daily   Or  . aspirin  325 mg Oral Daily  . atorvastatin  10 mg Per Tube q1800  . clopidogrel  75 mg Per NG tube Daily  . famotidine (PEPCID) IV  20 mg Intravenous Q12H  . heparin  5,000 Units Subcutaneous 3 times per day   Continuous Infusions: . sodium chloride 75 mL/hr at 01/11/15 0926  . feeding supplement (JEVITY 1.2 CAL) 1,000 mL (01/11/15 2322)   PRN Meds:.acetaminophen, sodium chloride  Antibiotics     Anti-infectives    None      Subjective:   Joshawa Dubin seen and examined today.  Patient wishes to have ice and do "ice therapy."  Per his wife, they were doing some type of speech therapy in [place] which involved rolling ice around the mouth to improve tongue strength and mobility.  He currently denies pain.  Feels weak. Patient understands that he may need a peg tube but would like to continue therapy.  Denies chest pain, abdominal pain, shortness of breath.   Objective:   Filed Vitals:   01/11/15 2107 01/12/15 0201 01/12/15 0617 01/12/15 1027  BP: 140/69 153/83 138/70 135/70  Pulse: 69 73 90 78  Temp: 98.6 F (37 C) 98.8 F (37.1 C) 98.1 F (36.7 C) 99 F (37.2 C)  TempSrc: Axillary Axillary Axillary Oral  Resp: 20 20 18 18   Weight:      SpO2: 100% 98% 97% 97%    Wt Readings from Last 3 Encounters:  01/11/15 79.3 kg (174 lb 13.2 oz)  01/09/15 81.602 kg (179 lb 14.4 oz)    No intake or output data in the 24 hours ending 01/12/15 1122  Exam  General: Well developed, well nourished, NAD, appears stated age  HEENT: NCAT,  mucous membranes moist. NGtube inp lace  Cardiovascular: S1 S2 auscultated, no rubs, murmurs or gallops. Regular rate and rhythm.  Respiratory: Clear to auscultation bilaterally with equal chest rise  Abdomen: Soft, nontender, nondistended, + bowel sounds  Extremities: warm dry without cyanosis clubbing or edema  Neuro: AAOx3, Right sided weakness (strength 4/5) as compared to the left, 5/5 strength.   Data Review   Micro Results No results found for this or any previous visit (from the past 240 hour(s)).  Radiology Reports Ct Abdomen Pelvis Wo Contrast  01/08/2015   CLINICAL DATA:  Status post cardiac catheterization. Swelling in the right lower quadrant. Possible hematoma.  EXAM: CT ABDOMEN AND PELVIS WITHOUT CONTRAST  TECHNIQUE: Multidetector CT imaging of the abdomen and pelvis was performed following the standard protocol without  IV contrast.  COMPARISON:  None.  FINDINGS: The lung bases appear emphysematous but are clear. No pleural or pericardial effusion is identified.  Contrast material from the patient's cardiac catheterization is seen in the kidneys and urinary bladder. Urinary bladder contrast creates streak artifact in the pelvis. Hemorrhage is seen in the right groin which is not confluent and measures approximately 5.7 cm AP x 8.7 cm transverse by at least 10 cm craniocaudal. Hematoma is also identified in the right pelvis measuring approximately 5.1 cm AP by 3.6 cm transverse by 7.2 cm craniocaudal.  The gallbladder, spleen, adrenal glands, pancreas and kidneys are unremarkable. A tiny low attenuating lesion in the left hepatic lobe anteriorly and near the dome is likely a cyst. The liver is otherwise unremarkable. No lymphadenopathy is  seen. Scattered aortoiliac atherosclerosis without aneurysm is noted. The stomach small bowel and appendix appear normal.  There is a superior endplate compression fracture of L2 with vertebral body height loss of approximately 40%. The fracture appears remote but cannot be definitively characterized. No lytic or sclerotic bony lesion is seen.  IMPRESSION: The study is positive for a moderate sized hematoma in the right groin extending into the right pelvis as described above. No other acute abnormality is identified.  Aortoiliac atherosclerosis without aneurysm.  L2 superior endplate compression fracture appears remote but cannot be definitively characterized.   Electronically Signed   By: Drusilla Kanner M.D.   On: 01/08/2015 10:38   Mr Angiogram Neck W Wo Contrast  01/10/2015   CLINICAL DATA:  Acute brainstem infarction.  EXAM: MRA NECK WITHOUT AND WITH CONTRAST  MRA HEAD WITHOUT CONTRAST  TECHNIQUE: Multiplanar and multiecho pulse sequences of the neck were obtained without and with intravenous contrast. Angiographic images of the neck were obtained using MRA technique without and with  intravenous contast.; Angiographic images of the Circle of Willis were obtained using MRA technique without intravenous contrast.  CONTRAST:  20mL MULTIHANCE GADOBENATE DIMEGLUMINE 529 MG/ML IV SOLN  COMPARISON:  MRI 01/09/2015.  Ultrasound 01/09/2015.  FINDINGS: MRA NECK FINDINGS  Branching pattern of the brachiocephalic vessels from the arch is normal. No origin stenoses.  Both common carotid arteries are widely patent to their respective bifurcation. Both carotid bifurcations are normal without narrowing or irregularity. Both cervical internal carotid arteries are tortuous but widely patent an otherwise normal.  The right vertebral artery is patent at its origin. I think there is narrowing on the order of 30%. Beyond that, the vessel is widely patent through the cervical region.  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 There does not appear to be any reconstitution until the skullbase level where there is probably minor reconstitution of the distal left vertebral artery, supplying at least left PICA. There may be minimal reconstituted flow contributed to the basilar.  MRA HEAD FINDINGS  Both internal carotid arteries are widely patent through the skullbase. No siphon stenosis. The anterior and middle cerebral vessels are patent without proximal stenosis, aneurysm or vascular malformation. The right PCA takes a fetal origin from the anterior circulation. There is a large posterior communicating artery on the left, but with some communication with the distal basilar.  The right vertebral artery is a large vessel that supplies the basilar. No basilar stenosis. No antegrade flow is seen in the left vertebral artery, though this study is not as sensitive as information gleaned from the contrast-enhanced study, which showed some reconstitution of the distal left vertebral artery by cervical collaterals at the skullbase, supplying the left PICA and with minimal contribution to the basilar. Both superior  cerebellar arteries show flow. As noted previously, the right PCA receives it supply from the anterior circulation. The left PCA receives it supply from both the basilar tip and the anterior circulation.  IMPRESSION: No anterior circulation disease identified.  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 Reconstitution of the distal left vertebral artery at the skullbase level, supplying left PICA and giving minimal contribution to the basilar.  No basilar stenosis. Superior cerebellar and posterior cerebral arteries show flow presently.   Electronically Signed   By: Paulina Fusi M.D.   On: 01/10/2015 11:48   US Carotid Bilateral  01/09/2015   CLINICAL DATA:  History of stroke, hypertension, CAD, visual disturbance, hyperlipidemia.  EXAM: BILATERAL CAROTID DUPLEX ULTRASOUND  TECHNIQUE: Wallace Cullens scale imaging, color Doppler and duplex ultrasound were performed of bilateral carotid and vertebral arteries in the neck.  COMPARISON:  Head CT- 01/08/2015; 12/19/2014  FINDINGS: Criteria: Quantification of carotid stenosis is based on velocity parameters that correlate the residual internal carotid diameter with NASCET-based stenosis levels, using the diameter of the distal internal carotid lumen as the denominator for stenosis measurement.  The following velocity measurements were obtained:  RIGHT  ICA:  104/15 cm/sec  CCA:  133/18 cm/sec  SYSTOLIC ICA/CCA RATIO:  0.8  DIASTOLIC ICA/CCA RATIO:  0.9  ECA:  95 cm/sec  LEFT  ICA:  115/21 cm/sec  CCA:  134/17 cm/sec  SYSTOLIC ICA/CCA RATIO:  0.9  DIASTOLIC ICA/CCA RATIO:  1.3  ECA:  115 cm/sec  RIGHT CAROTID ARTERY: There is a minimal amount of eccentric mixed echogenic plaque within the right carotid bulb (image 12), extending to involve the origin and proximal aspects of the right internal carotid artery (image 18), not resulting in elevated peak systolic velocities within the interrogated course of the right internal carotid artery to suggest a hemodynamically significant  stenosis.  RIGHT VERTEBRAL ARTERY:  Antegrade flow  LEFT CAROTID ARTERY: There is a very minimal amount of intimal wall thickening involving the origin and proximal aspects of the left internal carotid artery (image 49), not resulting in elevated peak systolic velocities within the interrogated course of the left internal carotid artery to suggest a hemodynamically significant stenosis.  LEFT VERTEBRAL ARTERY:  Antegrade flow  IMPRESSION: Very minimal amount of bilateral intimal wall thickening and atherosclerotic plaque, right subjectively greater than left, not resulting in a hemodynamically significant stenosis.   Electronically Signed   By: Simonne Come M.D.   On: 01/09/2015 10:43   Dg Abd Portable 1v  01/10/2015   CLINICAL DATA:  Nasogastric tube placement.  Esophageal stricture.  EXAM: PORTABLE ABDOMEN - 1 VIEW  COMPARISON:  None.  FINDINGS: Feeding tube is identified within the proximal stomach. Mild motion degradation. Nonobstructive bowel gas pattern. Mid and lower pelvis excluded.  IMPRESSION: Feeding tube terminating at the proximal stomach.   Electronically Signed   By: Jeronimo Greaves M.D.   On: 01/10/2015 00:19   Dg Swallowing Func-speech Pathology  01/10/2015    Objective Swallowing Evaluation:    Patient Details  Name: KESHON MARKOVITZ MRN: [id number] Date of Birth: Apr 03, 1934  Today's Date: 01/10/2015 Time: SLP Start Time (ACUTE ONLY): 1430-SLP Stop Time (ACUTE ONLY): 1500 SLP Time Calculation (min) (ACUTE ONLY): 30 min  Past Medical History:  Past Medical History  Diagnosis Date  . Essential hypertension   . Dyslipidemia   . CAD (coronary artery disease)   . S/P right coronary artery (RCA) stent placement   . OSA (obstructive sleep apnea)   . Esophageal stricture   . Seronegative arthritis   . Fatty liver    Past Surgical History: No past surgical history on file. HPI:  Other Pertinent Information: DORA CLAUSS is a 81 y.o. male with past  medical history of hypertension, dyslipidemia, coronary  artery disease,  obstructive sleep apnea on C Pap, seronegative arthritis. The patient was  brought in from [facility].  No Data Recorded  Assessment / Plan / Recommendation CHL IP CLINICAL IMPRESSIONS 01/10/2015  Therapy Diagnosis Severe oral phase dysphagia;Moderate pharyngeal phase  dysphagia  Clinical Impression MBSS completed.  Pt presents with severe oral and  moderate pharyngeal dysphagia c/b severe oral discoordination and poor a/p  transit of the bolus.  The pt was unable  to adequately manipulate thins or  purees.  The entire thin bolus was anteriorly lost and the pt was unable  to move the puree bolus into the pharynx requiring oral suctioning.   Pharyngeally the pt presented with a delayed swallow trigger, decreased  constriction with mild vallecular and pyriform residue and decreased  epiglottic deflection with tsp boluses of honey.  Given the pt's current  swallowing ability rx keep the pt NPO.  Rx therapeutic feeds of honey  liquids via tsp sips with the SLP only.  The pt will require intense ST  f/u at the next level of care.        CHL IP TREATMENT RECOMMENDATION 01/10/2015  Treatment Recommendations Therapy as outlined in treatment plan below     CHL IP DIET RECOMMENDATION 01/10/2015  SLP Diet Recommendations NPO  Liquid Administration via (None)  Medication Administration Via alternative means  Compensations (None)  Postural Changes and/or Swallow Maneuvers (None)     CHL IP OTHER RECOMMENDATIONS 01/10/2015  Recommended Consults (None)  Oral Care Recommendations Oral care BID  Other Recommendations (None)     CHL IP FOLLOW UP RECOMMENDATIONS 01/10/2015  Follow up Recommendations Inpatient Rehab     CHL IP FREQUENCY AND DURATION 01/10/2015  Speech Therapy Frequency (ACUTE ONLY) min 2x/week  Treatment Duration 2 weeks         CHL IP REASON FOR REFERRAL 01/10/2015  Reason for Referral Objectively evaluate swallowing function     CHL IP ORAL PHASE 01/10/2015  Lips (None)  Tongue (None)  Mucous membranes  (None)  Nutritional status (None)  Other (None)  Oxygen therapy (None)  Oral Phase Impaired  Oral - Pudding Teaspoon (None)  Oral - Pudding Cup (None)  Oral - Honey Teaspoon (None)  Oral - Honey Cup (None)  Oral - Honey Syringe (None)  Oral - Nectar Teaspoon (None)  Oral - Nectar Cup (None)  Oral - Nectar Straw (None)  Oral - Nectar Syringe (None)  Oral - Ice Chips (None)  Oral - Thin Teaspoon (None)  Oral - Thin Cup (None)  Oral - Thin Straw (None)  Oral - Thin Syringe (None)  Oral - Puree (None)  Oral - Mechanical Soft (None)  Oral - Regular (None)  Oral - Multi-consistency (None)  Oral - Pill (None)  Oral Phase - Comment (None)      CHL IP PHARYNGEAL PHASE 01/10/2015  Pharyngeal Phase Impaired  Pharyngeal - Pudding Teaspoon (None)  Penetration/Aspiration details (pudding teaspoon) (None)  Pharyngeal - Pudding Cup (None)  Penetration/Aspiration details (pudding cup) (None)  Pharyngeal - Honey Teaspoon (None)  Penetration/Aspiration details (honey teaspoon) (None)  Pharyngeal - Honey Cup (None)  Penetration/Aspiration details (honey cup) (None)  Pharyngeal - Honey Syringe (None)  Penetration/Aspiration details (honey syringe) (None)  Pharyngeal - Nectar Teaspoon (None)  Penetration/Aspiration details (nectar teaspoon) (None)  Pharyngeal - Nectar Cup (None)  Penetration/Aspiration details (nectar cup) (None)  Pharyngeal - Nectar Straw (None)  Penetration/Aspiration details (nectar straw) (None)  Pharyngeal - Nectar Syringe (None)  Penetration/Aspiration details (nectar syringe) (None)  Pharyngeal - Ice Chips (None)  Penetration/Aspiration details (ice chips) (None)  Pharyngeal - Thin Teaspoon (None)  Penetration/Aspiration details (thin teaspoon) (None)  Pharyngeal - Thin Cup (None)  Penetration/Aspiration details (thin cup) (None)  Pharyngeal - Thin Straw (None)  Penetration/Aspiration details (thin straw) (None)  Pharyngeal - Thin Syringe (None)  Penetration/Aspiration details (thin syringe') (None)  Pharyngeal -  Puree (None)  Penetration/Aspiration details (puree) (None)  Pharyngeal - Mechanical Soft (None)  Penetration/Aspiration details (  mechanical soft) (None)  Pharyngeal - Regular (None)  Penetration/Aspiration details (regular) (None)  Pharyngeal - Multi-consistency (None)  Penetration/Aspiration details (multi-consistency) (None)  Pharyngeal - Pill (None)  Penetration/Aspiration details (pill) (None)  Pharyngeal Comment (None)             Fleet Contras 01/10/2015, 3:26 PM  Dimas Aguas, MA, CCC-SLP Acute Rehab SLP [phone]]     Mr Maxine Glenn Head/brain Wo Cm  01/10/2015   CLINICAL DATA:  Acute brainstem infarction.  EXAM: MRA NECK WITHOUT AND WITH CONTRAST  MRA HEAD WITHOUT CONTRAST  TECHNIQUE: Multiplanar and multiecho pulse sequences of the neck were obtained without and with intravenous contrast. Angiographic images of the neck were obtained using MRA technique without and with intravenous contast.; Angiographic images of the Circle of Willis were obtained using MRA technique without intravenous contrast.  CONTRAST:  20mL MULTIHANCE GADOBENATE DIMEGLUMINE 529 MG/ML IV SOLN  COMPARISON:  MRI 01/09/2015.  Ultrasound 01/09/2015.  FINDINGS: MRA NECK FINDINGS  Branching pattern of the brachiocephalic vessels from the arch is normal. No origin stenoses.  Both common carotid arteries are widely patent to their respective bifurcation. Both carotid bifurcations are normal without narrowing or irregularity. Both cervical internal carotid arteries are tortuous but widely patent an otherwise normal.  The right vertebral artery is patent at its origin. I think there is narrowing on the order of 30%. Beyond that, the vessel is widely patent through the cervical region.  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 There does not appear to be any reconstitution until the skullbase level where there is probably minor reconstitution of the distal left vertebral artery, supplying at least left PICA. There may be minimal  reconstituted flow contributed to the basilar.  MRA HEAD FINDINGS  Both internal carotid arteries are widely patent through the skullbase. No siphon stenosis. The anterior and middle cerebral vessels are patent without proximal stenosis, aneurysm or vascular malformation. The right PCA takes a fetal origin from the anterior circulation. There is a large posterior communicating artery on the left, but with some communication with the distal basilar.  The right vertebral artery is a large vessel that supplies the basilar. No basilar stenosis. No antegrade flow is seen in the left vertebral artery, though this study is not as sensitive as information gleaned from the contrast-enhanced study, which showed some reconstitution of the distal left vertebral artery by cervical collaterals at the skullbase, supplying the left PICA and with minimal contribution to the basilar. Both superior cerebellar arteries show flow. As noted previously, the right PCA receives it supply from the anterior circulation. The left PCA receives it supply from both the basilar tip and the anterior circulation.  IMPRESSION: No anterior circulation disease identified.  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 Reconstitution of the distal left vertebral artery at the skullbase level, supplying left PICA and giving minimal contribution to the basilar.  No basilar stenosis. Superior cerebellar and posterior cerebral arteries show flow presently.   Electronically Signed   By: Paulina Fusi M.D.   On: 01/10/2015 11:48   Ct Head Limited W/o Cm  01/08/2015   CLINICAL DATA:  Cardiac catheterization today. Altered mental status.  EXAM: CT HEAD WITHOUT CONTRAST  TECHNIQUE: Contiguous axial images were obtained from the base of the skull through the vertex without intravenous contrast.  COMPARISON:  None.  FINDINGS: Vascular contrast is identified from preceding cardiac catheterization.  Mild atrophy, appropriate for age.  Negative for hydrocephalus.   Negative for acute or chronic  ischemia. Negative for hemorrhage or mass  Normal enhancement.  Calvarium intact.  IMPRESSION: Age related atrophy.  No acute abnormality.  These results will be called to the ordering clinician or representative by the Radiologist Assistant, and communication documented in the PACS or zVision Dashboard.   Electronically Signed   By: Marlan Palau M.D.   On: 01/08/2015 10:53   Mr Brain Ltd W/o Cm  01/09/2015   CLINICAL DATA:  Expressive a aphasia following placement of cardiac stent 01/08/2015  EXAM: MRI HEAD WITHOUT CONTRAST  TECHNIQUE: Multiplanar, multiecho pulse sequences of the brain and surrounding structures were obtained without intravenous contrast.  COMPARISON:  CT head without contrast 01/08/2015. Carotid ultrasound 01/09/2015.  FINDINGS: Bilateral brainstem infarcts are present. The largest areas in the inferior pons right left paramedian infarct extends over 2 cm anterior to posterior. Additional punctate foci of restricted diffusion are present within the occipital lobes bilaterally in the high left parietal lobe. There is a punctate area of focal infarct along the precentral sulcus on the right.  T2 changes are evident along the infarcts in the brainstem. Mild atrophy and periventricular white matter changes are chronic. No acute hemorrhage is evident.  Flow is present in the major intracranial arteries. There is a thick ram about a cystic lesion at floor of the left maxillary sinus. The collection measures 17 mm maximally and appears P associated with the alveolar ridge. The paranasal sinuses and mastoid air cells are otherwise clear. Skullbase is unremarkable. Midline structures are normal.  IMPRESSION: 1. Acute nonhemorrhagic bilateral infarcts of the brainstem. 2. Acute nonhemorrhagic bilateral occipital lobe infarcts. 3. Acute nonhemorrhagic punctate left parietal lobe infarct. 4. Acute nonhemorrhagic punctate posterior right frontal lobe infarct along the  precentral gyrus. 5. Mild atrophy and white matter disease likely reflects the sequela of chronic microvascular ischemia in addition to the acute infarcts. 6. 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 These results were called by telephone at the time of interpretation on 01/09/2015 at 12:08 pm to Dr. Arnoldo Hooker, who verbally acknowledged these results.   Electronically Signed   By: Marin Roberts M.D.   On: 01/09/2015 12:09    CBC  Recent Labs Lab 01/08/15 1222 01/09/15 2304 01/10/15 0744  WBC 13.5* 8.5 8.0  HGB 13.4 11.5* 12.3*  HCT 40.7 33.0* 35.6*  PLT 262 209 211  MCV 96 93.0 93.7  MCH 31.6 32.4 32.4  MCHC 32.9 34.8 34.6  RDW 13.2 13.2 13.2  LYMPHSABS 1.0 1.6  --   MONOABS 0.7 0.8  --   EOSABS 0.0 0.1  --   BASOSABS 0.1 0.0  --     Chemistries   Recent Labs Lab 01/08/15 1222 01/09/15 0602 01/09/15 2304 01/10/15 0744  NA 137 142 142 140  K 4.1 4.0 3.8 3.9  CL 108 112* 110 110  CO2 24 24 23 22   GLUCOSE 140* 107* 101* 94  BUN 22* 22* 19 18  CREATININE 1.03 0.96 0.96 0.92  CALCIUM 8.3* 8.5* 8.5 8.7  AST  --   --  21  --   ALT  --   --  20  --   ALKPHOS  --   --  45  --   BILITOT  --   --  0.8  --    ------------------------------------------------------------------------------------------------------------------ estimated creatinine clearance is 63 mL/min (by C-G formula based on Cr of 0.92). ------------------------------------------------------------------------------------------------------------------  Recent  Labs  01/09/15 2304  HGBA1C 5.4   ------------------------------------------------------------------------------------------------------------------  Recent Labs  01/09/15 2304  CHOL 149  HDL 42  LDLCALC 93  TRIG 68  CHOLHDL 3.5    ------------------------------------------------------------------------------------------------------------------ No results for input(s): TSH, T4TOTAL, T3FREE, THYROIDAB in the last 72 hours.  Invalid input(s): FREET3 ------------------------------------------------------------------------------------------------------------------ No results for input(s): VITAMINB12, FOLATE, FERRITIN, TIBC, IRON, RETICCTPCT in the last 72 hours.  Coagulation profile  Recent Labs Lab 01/09/15 2304  INR 1.18    No results for input(s): DDIMER in the last 72 hours.  Cardiac Enzymes No results for input(s): CKMB, TROPONINI, MYOGLOBIN in the last 168 hours.  Invalid input(s): CK ------------------------------------------------------------------------------------------------------------------ Invalid input(s): POCBNP    Chrystine Frogge D.O. on 01/12/2015 at 11:22 AM  Between 7am to 7pm - Pager - [phone]  After 7pm go to [email or web address] - password [email or web address]  And look for the night coverage person covering for me after hours  [facility] Office  [phone]

## 2015-01-12 NOTE — Progress Notes (Signed)
Occupational Therapy Treatment Patient Details Name: Jerome DanasJack L Dena MRN: [id number] DOB: 1934-07-20 Today's Date: 01/12/2015    History of present illness 81 yo male [facility] elective angiography undergoing PCI with drug eluting stent for RCA obstruction. 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 Pt with posterior lateral lean. Pt with ataxia and weakness in R LE. Pt hyperextending R LE and decr proprioception. PT demonstrates R UE deficits affecting all adls. Pt progressing toward goals and eager to continue therapy. Pt walked daily per family.    Follow Up Recommendations  CIR    Equipment Recommendations  Other (comment)    Recommendations for Other Services Rehab consult    Precautions / Restrictions Precautions Precautions: Fall Precaution Comments: panda Restrictions Weight Bearing Restrictions: No       Mobility Bed Mobility               General bed mobility comments: in chair on arrival. Wife and pt report extended time in chair for comfort with secretions  Transfers Overall transfer level: Needs assistance Equipment used: Rolling walker (2 wheeled) Transfers: Sit to/from Stand Sit to Stand: +2 physical assistance;Mod assist         General transfer comment: performed x3, verbal cues for hand placement, cues for weight shift to achieve static standing. Pt with R posterior lean with immediate standing    Balance Overall balance assessment: Needs assistance Sitting-balance support: Bilateral upper extremity supported;Feet supported Sitting balance-Leahy Scale: Fair Sitting balance - Comments: Pt is unable to use core muscles and pull self into sitting  position. pt requiers (A) and BIL UE Postural control: Right lateral lean Standing balance support: Bilateral upper extremity supported;During functional activity Standing balance-Leahy Scale: Poor                     ADL Overall ADL's : Needs assistance/impaired Eating/Feeding: NPO Eating/Feeding Details (indicate cue type and reason): [facility] Grooming: Oral care;Wash/dry face;Moderate assistance;Sitting Grooming Details (indicate cue type and reason): noted R ue weakness decr grasp, attempting to wipe drool from poor lip closure                 Toilet Transfer: +2 for physical assistance;Moderate assistance           Functional mobility during ADLs: +2 for physical assistance;Moderate assistance General ADL Comments: Pt very motivated to ambulate and get progressing with therapy on arrival.pt wants ice chips for oral trials. Pt and family educated taht SLP will arrive today.       Vision                 Additional Comments: Pt reports improvements with eye patch use. pt using eye patch over L eye this session   Perception     Praxis      Cognition   Behavior During Therapy: WFL for tasks assessed/performed Overall Cognitive Status: Within Functional Limits for tasks assessed                       Extremity/Trunk Assessment               Exercises Other  Exercises Other Exercises: Pt demonstrates decr wrist extension, decr thumb flexion and abduction, pt with weak digit extension. pt is unable to demonstrate thumbs up. Pt able to show two fingers.    Shoulder Instructions       General Comments      Pertinent Vitals/ Pain       Pain Assessment: No/denies pain  Home Living Family/patient expects to be discharged to:: Private residence Living Arrangements: Spouse/significant other Available Help at Discharge: Family Type of Home: House                                  Prior  Functioning/Environment              Frequency Min 3X/week     Progress Toward Goals  OT Goals(current goals can now be found in the care plan section)  Progress towards OT goals: Progressing toward goals  Acute Rehab OT Goals Patient Stated Goal: to get better OT Goal Formulation: With patient/family Time For Goal Achievement: 01/24/15 Potential to Achieve Goals: Good ADL Goals Pt Will Perform Grooming: sitting;with mod assist Pt Will Perform Upper Body Bathing: with min assist;bed level Pt Will Transfer to Toilet: with +2 assist;with mod assist;bedside commode Additional ADL Goal #1: Pt will don eye patch with mod (A)  Additional ADL Goal #2: Pt will complete bed mobility mod (A) with hob elevated and bed rails  Plan Discharge plan remains appropriate    Co-evaluation    PT/OT/SLP Co-Evaluation/Treatment: Yes Reason for Co-Treatment: Complexity of the patient's impairments (multi-system involvement);For patient/therapist safety   OT goals addressed during session: ADL's and self-care      End of Session Equipment Utilized During Treatment: Gait belt;Rolling walker   Activity Tolerance Patient tolerated treatment well   Patient Left in bed;with call bell/phone within reach;with family/visitor present (ordered a blue pillow cushion to decr pressure sores)   Nurse Communication Mobility status;Precautions        Time: 4540-9811 OT Time Calculation (min): 30 min  Charges: OT General Charges $OT Visit: 1 Procedure OT Treatments $Therapeutic Activity: 8-22 mins  Boone Master B 01/12/2015, 12:03 PM  Pager: [phone]

## 2015-01-12 NOTE — Progress Notes (Signed)
Rehab Admissions Coordinator Note:  Patient was screened by Militza Devery L for appropriateness for an Inpatient Acute Rehab Consult.  At this time, we are recommending Inpatient Rehab consult.  Able Malloy L 01/12/2015, 9:50 AM  I can be reached at [phone].

## 2015-01-12 NOTE — Progress Notes (Addendum)
Stroke Team Progress Note  HISTORY 81 y.o. male who underwent elective cardiac cath for chest pain with abnormal stress and had a distal RCA stenosis and had a DES placed. Following the procedure, he was noted to be agitated. He was given further sedation with suspicion for possible adverse reaction to his initial sedation. He was noted to have difficulty speaking and as he was less agitated, it became clear that the aphasia persisted. An MRI was obtained which shows multifocal infarcts in both the anterior and posterior circulation consistent with cardiac embolus.    SUBJECTIVE Multiple family members are at the bedside. Patient sitting up in the chair.    OBJECTIVE Most recent Vital Signs: Filed Vitals:   01/11/15 2107 01/12/15 0201 01/12/15 0617 01/12/15 1027  BP: 140/69 153/83 138/70 135/70  Pulse: 69 73 90 78  Temp: 98.6 F (37 C) 98.8 F (37.1 C) 98.1 F (36.7 C) 99 F (37.2 C)  TempSrc: Axillary Axillary Axillary Oral  Resp: Weight:      SpO2: 100% 98% 97% 97%   CBG (last 3)   Recent Labs  01/11/15 2335 01/12/15 0402 01/12/15 0722  GLUCAP 111* 143* 142*    IV Fluid Intake:   . sodium chloride 75 mL/hr at 01/11/15 0926  . feeding supplement (JEVITY 1.2 CAL) 1,000 mL (01/11/15 2322)    MEDICATIONS  . antiseptic oral rinse  7 mL Mouth Rinse BID  . aspirin  300 mg Rectal Daily   Or  . aspirin  325 mg Oral Daily  . atorvastatin  10 mg Per Tube q1800  . clopidogrel  75 mg Per NG tube Daily  . famotidine (PEPCID) IV  20 mg Intravenous Q12H  . heparin  5,000 Units Subcutaneous 3 times per day   PRN:  acetaminophen, sodium chloride  Diet:  Diet NPO time specified  DVT Prophylaxis:  heparin s/q  CLINICALLY SIGNIFICANT STUDIES Basic Metabolic Panel:   Recent Labs Lab 01/09/15 2304 01/10/15 0744  NA 142 140  K 3.8 3.9  CL 110 110  CO2 23 22  GLUCOSE 101* 94  BUN 19 18  CREATININE 0.96 0.92  CALCIUM 8.5 8.7   Liver Function Tests:   Recent  Labs Lab 01/09/15 2304  AST 21  ALT 20  ALKPHOS 45  BILITOT 0.8  PROT 6.0  ALBUMIN 3.0*   CBC:   Recent Labs Lab 01/08/15 1222 01/09/15 2304 01/10/15 0744  WBC 13.5* 8.5 8.0  NEUTROABS 11.7* 6.1  --   HGB 13.4 11.5* 12.3*  HCT 40.7 33.0* 35.6*  MCV 96 93.0 93.7  PLT 262 209 211   Coagulation:   Recent Labs Lab 01/09/15 2304  LABPROT 15.1  INR 1.18   Cardiac Enzymes: No results for input(s): CKTOTAL, CKMB, CKMBINDEX, TROPONINI in the last 168 hours. Urinalysis: No results for input(s): COLORURINE, LABSPEC, PHURINE, GLUCOSEU, HGBUR, BILIRUBINUR, KETONESUR, PROTEINUR, UROBILINOGEN, NITRITE, LEUKOCYTESUR in the last 168 hours.  Invalid input(s): APPERANCEUR Lipid Panel    Component Value Date/Time   CHOL 149 01/09/2015 2304   TRIG 68 01/09/2015 2304   HDL 42 01/09/2015 2304   CHOLHDL 3.5 01/09/2015 2304   VLDL 14 01/09/2015 2304   LDLCALC 93 01/09/2015 2304   HgbA1C  Lab Results  Component Value Date   HGBA1C 5.4 01/09/2015    Urine Drug Screen:  No results found for: LABOPIA, COCAINSCRNUR, LABBENZ, AMPHETMU, THCU, LABBARB  Alcohol Level: No results for input(s): ETH in the last 168 hours.  Mr Angiogram Neck W Wo Contrast  01/10/2015   CLINICAL DATA:  Acute brainstem infarction.  EXAM: MRA NECK WITHOUT AND WITH CONTRAST  MRA HEAD WITHOUT CONTRAST  TECHNIQUE: Multiplanar and multiecho pulse sequences of the neck were obtained without and with intravenous contrast. Angiographic images of the neck were obtained using MRA technique without and with intravenous contast.; Angiographic images of the Circle of Willis were obtained using MRA technique without intravenous contrast.  CONTRAST:  20mL MULTIHANCE GADOBENATE DIMEGLUMINE 529 MG/ML IV SOLN  COMPARISON:  MRI 01/09/2015.  Ultrasound 01/09/2015.  FINDINGS: MRA NECK FINDINGS  Branching pattern of the brachiocephalic vessels from the arch is normal. No origin stenoses.  Both common carotid arteries are widely patent to  their respective bifurcation. Both carotid bifurcations are normal without narrowing or irregularity. Both cervical internal carotid arteries are tortuous but widely patent an otherwise normal.  The right vertebral artery is patent at its origin. I think there is narrowing on the order of 30%. Beyond that, the vessel is widely patent through the cervical region.  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 There does not appear to be any reconstitution until the skullbase level where there is probably minor reconstitution of the distal left vertebral artery, supplying at least left PICA. There may be minimal reconstituted flow contributed to the basilar.  MRA HEAD FINDINGS  Both internal carotid arteries are widely patent through the skullbase. No siphon stenosis. The anterior and middle cerebral vessels are patent without proximal stenosis, aneurysm or vascular malformation. The right PCA takes a fetal origin from the anterior circulation. There is a large posterior communicating artery on the left, but with some communication with the distal basilar.  The right vertebral artery is a large vessel that supplies the basilar. No basilar stenosis. No antegrade flow is seen in the left vertebral artery, though this study is not as sensitive as information gleaned from the contrast-enhanced study, which showed some reconstitution of the distal left vertebral artery by cervical collaterals at the skullbase, supplying the left PICA and with minimal contribution to the basilar. Both superior cerebellar arteries show flow. As noted previously, the right PCA receives it supply from the anterior circulation. The left PCA receives it supply from both the basilar tip and the anterior circulation.  IMPRESSION: No anterior circulation disease identified.  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 Reconstitution of the distal left vertebral artery at the skullbase level, supplying left PICA and giving minimal contribution  to the basilar.  No basilar stenosis. Superior cerebellar and posterior cerebral arteries show flow presently.   Electronically Signed   By: Paulina Fusi M.D.   On: 01/10/2015 11:48   Dg Swallowing Func-speech Pathology  01/10/2015    Objective Swallowing Evaluation:    Patient Details  Name: Jerome Bowen MRN: [id number] Date of Birth: [birth year]  Today's Date: 01/10/2015 Time: SLP Start Time (ACUTE ONLY): 1430-SLP Stop Time (ACUTE ONLY): 1500 SLP Time Calculation (min) (ACUTE ONLY): 30 min  Past Medical History:  Past Medical History  Diagnosis Date  . Essential hypertension   . Dyslipidemia   . CAD (coronary artery disease)   . S/P right coronary artery (RCA) stent placement   . OSA (obstructive sleep apnea)   . Esophageal stricture   . Seronegative arthritis   . Fatty liver    Past Surgical History: No past surgical history on file. HPI:  Other Pertinent Information: Jerome Bowen is a 81 y.o. male with past  medical history  of hypertension, dyslipidemia, coronary artery disease,  obstructive sleep apnea on C Pap, seronegative arthritis. The patient was  brought in from [facility].  No Data Recorded  Assessment / Plan / Recommendation CHL IP CLINICAL IMPRESSIONS 01/10/2015  Therapy Diagnosis Severe oral phase dysphagia;Moderate pharyngeal phase  dysphagia  Clinical Impression MBSS completed.  Pt presents with severe oral and  moderate pharyngeal dysphagia c/b severe oral discoordination and poor a/p  transit of the bolus.  The pt was unable to adequately manipulate thins or  purees.  The entire thin bolus was anteriorly lost and the pt was unable  to move the puree bolus into the pharynx requiring oral suctioning.   Pharyngeally the pt presented with a delayed swallow trigger, decreased  constriction with mild vallecular and pyriform residue and decreased  epiglottic deflection with tsp boluses of honey.  Given the pt's current  swallowing ability rx keep the pt NPO.  Rx therapeutic feeds of honey  liquids  via tsp sips with the SLP only.  The pt will require intense ST  f/u at the next level of care.        CHL IP TREATMENT RECOMMENDATION 01/10/2015  Treatment Recommendations Therapy as outlined in treatment plan below     CHL IP DIET RECOMMENDATION 01/10/2015  SLP Diet Recommendations NPO  Liquid Administration via (None)  Medication Administration Via alternative means  Compensations (None)  Postural Changes and/or Swallow Maneuvers (None)     CHL IP OTHER RECOMMENDATIONS 01/10/2015  Recommended Consults (None)  Oral Care Recommendations Oral care BID  Other Recommendations (None)     CHL IP FOLLOW UP RECOMMENDATIONS 01/10/2015  Follow up Recommendations Inpatient Rehab     CHL IP FREQUENCY AND DURATION 01/10/2015  Speech Therapy Frequency (ACUTE ONLY) min 2x/week  Treatment Duration 2 weeks         CHL IP REASON FOR REFERRAL 01/10/2015  Reason for Referral Objectively evaluate swallowing function     CHL IP ORAL PHASE 01/10/2015  Lips (None)  Tongue (None)  Mucous membranes (None)  Nutritional status (None)  Other (None)  Oxygen therapy (None)  Oral Phase Impaired  Oral - Pudding Teaspoon (None)  Oral - Pudding Cup (None)  Oral - Honey Teaspoon (None)  Oral - Honey Cup (None)  Oral - Honey Syringe (None)  Oral - Nectar Teaspoon (None)  Oral - Nectar Cup (None)  Oral - Nectar Straw (None)  Oral - Nectar Syringe (None)  Oral - Ice Chips (None)  Oral - Thin Teaspoon (None)  Oral - Thin Cup (None)  Oral - Thin Straw (None)  Oral - Thin Syringe (None)  Oral - Puree (None)  Oral - Mechanical Soft (None)  Oral - Regular (None)  Oral - Multi-consistency (None)  Oral - Pill (None)  Oral Phase - Comment (None)      CHL IP PHARYNGEAL PHASE 01/10/2015  Pharyngeal Phase Impaired  Pharyngeal - Pudding Teaspoon (None)  Penetration/Aspiration details (pudding teaspoon) (None)  Pharyngeal - Pudding Cup (None)  Penetration/Aspiration details (pudding cup) (None)  Pharyngeal - Honey Teaspoon (None)  Penetration/Aspiration details (honey  teaspoon) (None)  Pharyngeal - Honey Cup (None)  Penetration/Aspiration details (honey cup) (None)  Pharyngeal - Honey Syringe (None)  Penetration/Aspiration details (honey syringe) (None)  Pharyngeal - Nectar Teaspoon (None)  Penetration/Aspiration details (nectar teaspoon) (None)  Pharyngeal - Nectar Cup (None)  Penetration/Aspiration details (nectar cup) (None)  Pharyngeal - Nectar Straw (None)  Penetration/Aspiration details (nectar straw) (None)  Pharyngeal - Nectar Syringe (None)  Penetration/Aspiration  details (nectar syringe) (None)  Pharyngeal - Ice Chips (None)  Penetration/Aspiration details (ice chips) (None)  Pharyngeal - Thin Teaspoon (None)  Penetration/Aspiration details (thin teaspoon) (None)  Pharyngeal - Thin Cup (None)  Penetration/Aspiration details (thin cup) (None)  Pharyngeal - Thin Straw (None)  Penetration/Aspiration details (thin straw) (None)  Pharyngeal - Thin Syringe (None)  Penetration/Aspiration details (thin syringe') (None)  Pharyngeal - Puree (None)  Penetration/Aspiration details (puree) (None)  Pharyngeal - Mechanical Soft (None)  Penetration/Aspiration details (mechanical soft) (None)  Pharyngeal - Regular (None)  Penetration/Aspiration details (regular) (None)  Pharyngeal - Multi-consistency (None)  Penetration/Aspiration details (multi-consistency) (None)  Pharyngeal - Pill (None)  Penetration/Aspiration details (pill) (None)  Pharyngeal Comment (None)             Jerome Bowen 01/10/2015, 3:26 PM  Dimas Aguas, MA, CCC-SLP Acute Rehab SLP [phone]]     Mr Maxine Glenn Head/brain Wo Cm  01/10/2015   CLINICAL DATA:  Acute brainstem infarction.  EXAM: MRA NECK WITHOUT AND WITH CONTRAST  MRA HEAD WITHOUT CONTRAST  TECHNIQUE: Multiplanar and multiecho pulse sequences of the neck were obtained without and with intravenous contrast. Angiographic images of the neck were obtained using MRA technique without and with intravenous contast.; Angiographic images of the Circle of Willis  were obtained using MRA technique without intravenous contrast.  CONTRAST:  20mL MULTIHANCE GADOBENATE DIMEGLUMINE 529 MG/ML IV SOLN  COMPARISON:  MRI 01/09/2015.  Ultrasound 01/09/2015.  FINDINGS: MRA NECK FINDINGS  Branching pattern of the brachiocephalic vessels from the arch is normal. No origin stenoses.  Both common carotid arteries are widely patent to their respective bifurcation. Both carotid bifurcations are normal without narrowing or irregularity. Both cervical internal carotid arteries are tortuous but widely patent an otherwise normal.  The right vertebral artery is patent at its origin. I think there is narrowing on the order of 30%. Beyond that, the vessel is widely patent through the cervical region.  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 There does not appear to be any reconstitution until the skullbase level where there is probably minor reconstitution of the distal left vertebral artery, supplying at least left PICA. There may be minimal reconstituted flow contributed to the basilar.  MRA HEAD FINDINGS  Both internal carotid arteries are widely patent through the skullbase. No siphon stenosis. The anterior and middle cerebral vessels are patent without proximal stenosis, aneurysm or vascular malformation. The right PCA takes a fetal origin from the anterior circulation. There is a large posterior communicating artery on the left, but with some communication with the distal basilar.  The right vertebral artery is a large vessel that supplies the basilar. No basilar stenosis. No antegrade flow is seen in the left vertebral artery, though this study is not as sensitive as information gleaned from the contrast-enhanced study, which showed some reconstitution of the distal left vertebral artery by cervical collaterals at the skullbase, supplying the left PICA and with minimal contribution to the basilar. Both superior cerebellar arteries show flow. As noted previously, the right PCA  receives it supply from the anterior circulation. The left PCA receives it supply from both the basilar tip and the anterior circulation.  IMPRESSION: No anterior circulation disease identified.  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 Reconstitution of the distal left vertebral artery at the skullbase level, supplying left PICA and giving minimal contribution to the basilar.  No basilar stenosis. Superior cerebellar and posterior cerebral arteries show flow presently.   Electronically Signed   By: Loraine Leriche  Shogry M.D.   On: 01/10/2015 11:48    CT of the brain no acute abnormality   MRI of the brain  B/l emoblic strokes   MRA of the brain  No anterior or posterior circulation stenosis.    Carotid Doppler  Very minimal amount of bilateral intimal wall thickening and atherosclerotic plaque, right subjectively greater than left, not resulting in a hemodynamically significant stenosis.  2D Echocardiogram  Left ventricle: The cavity size was normal. Wall thickness was increased in a pattern of mild LVH. Systolic function was vigorous. The estimated ejection fraction was in the range of 65% to 70%.  EKG  normal EKG, normal sinus rhythm, unchanged from previous tracings. For complete results please see formal report.   Therapy Recommendations CIR  Physical Exam Constitutional: Appears well-developed and well-nourished.  Psych: Affect appropriate to situation Eyes: No scleral injection HENT: No OP obstrucion Head: Normocephalic.  Cardiovascular: Normal rate and regular rhythm.  Respiratory: Effort normal and breath sounds normal to anterior ascultation GI: Soft. No distension. There is no tenderness.  Skin: WDI  Neuro: Mental Status: Patient is awake, alert, awake, interactive and appropriate Patient is able to contribute details to the history.  No signs of neglect. Aphasia is difficult to assess given difficulty with writing due to ataxia and severe dysarthria. I suspect  that he does not have any.  Cranial Nerves: II: Visual Fields are full. Pupils are equal, round, and reactive to light.  III,IV, VI: He is unable to cross midlien to the left, his right eye is abel to abduct but his left eye does not follow. 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  V: Facial sensation is symmetric to temperature VII: Facial movement is symmetric.  VIII: hearing is intact to voice Motor: Tone is normal. Bulk is normal. 5/5 strength was present on the left, he has 4/5 strength in the right arm and leg.  Sensory: Sensation is decreased to temp in the right arm, symmetric in the leg.  Deep Tendon Reflexes: 2+ and symmetric in the biceps and patellae.  Cerebellar: FNF and HKS are intact on the left, ataxic on the right.   ASSESSMENT Mr. Tressia DanasJack L Watters is a 81 y.o. male s/p cath and then stent placement who developed generalized weakness with expressive aphasia. Has bilateral ischemic infarcts post cardiac cath.    Chronic L VA stenosis  Resultant dysarthria, dysphagia, right hemiparesis, expressive aphasia   Hospital day # 3  TREATMENT/PLAN  On Dual antiplatelets post stent placement  peech following swallow. depending on progression, may need PEG placement.  CIR consult    NO FURTHER STROKE WORKUP INDICATED  Patient has a 10-15% risk of having another stroke over the next year, the highest risk is within 2 weeks of the most recent stroke/TIA (risk of having a stroke following a stroke or TIA is the same).  Ongoing risk factor control by Primary Care Physician  Stroke Service will sign off. Please call should any needs arise.  [facility] with Delia Headyramod Machelle Raybon in 2 months, order placed.   SIGNED Annie MainBIBY,SHARON I have personally examined this patient, reviewed notes, independently viewed imaging studies, participated in  medical decision making and plan of care. I have made any additions or clarifications directly to the above note. Agree with note above.Marland Kitchen. He has multiple bilateral posterior separation infarcts following cardiac catheterization. He likely  has incidental chronic left vertebral artery stenosis/occlusion as well. He  remains at risk for recurrent strokes, TIAs and neurological worsening. Continue ongoing stroke care and follow-up as outpatient in 2 months  Delia Heady, MD Medical Director [facility] Pager: [phone] 01/12/2015 4:31 PM    To contact Stroke Continuity provider, please refer to [email or web address]. After hours, contact General Neurology

## 2015-01-13 ENCOUNTER — Inpatient Hospital Stay (HOSPITAL_COMMUNITY): Payer: Medicare Other

## 2015-01-13 LAB — GLUCOSE, CAPILLARY
GLUCOSE-CAPILLARY: 135 mg/dL — AB (ref 70–99)
Glucose-Capillary: 105 mg/dL — ABNORMAL HIGH (ref 70–99)
Glucose-Capillary: 112 mg/dL — ABNORMAL HIGH (ref 70–99)
Glucose-Capillary: 139 mg/dL — ABNORMAL HIGH (ref 70–99)

## 2015-01-13 MED ORDER — DOCUSATE SODIUM 100 MG PO CAPS
100.0000 mg | ORAL_CAPSULE | Freq: Two times a day (BID) | ORAL | Status: DC
  Administered 2015-01-13 – 2015-01-14 (×2): 100 mg via ORAL
  Filled 2015-01-13 (×2): qty 1

## 2015-01-13 MED ORDER — SENNA 8.6 MG PO TABS
1.0000 | ORAL_TABLET | Freq: Two times a day (BID) | ORAL | Status: DC
  Administered 2015-01-13 – 2015-01-14 (×2): 8.6 mg via ORAL
  Filled 2015-01-13 (×2): qty 1

## 2015-01-13 MED ORDER — DIPHENHYDRAMINE-ZINC ACETATE 2-0.1 % EX CREA
TOPICAL_CREAM | Freq: Three times a day (TID) | CUTANEOUS | Status: DC | PRN
  Filled 2015-01-13: qty 28

## 2015-01-13 MED ORDER — RESOURCE THICKENUP CLEAR PO POWD
ORAL | Status: DC | PRN
  Filled 2015-01-13 (×2): qty 125

## 2015-01-13 NOTE — Progress Notes (Signed)
MBSS complete. Full report located under chart review in imaging section.  Mashonda Broski Paiewonsky, M.A. CCC-SLP [phone]  

## 2015-01-13 NOTE — PMR Pre-admission (Signed)
PMR Admission Coordinator Pre-Admission Assessment  Patient: Jerome Bowen is an 81 y.o., male MRN: [id number] DOB: 1934/04/01 Height:   Weight: 81.8 kg (180 lb 5.4 oz)              Insurance Information HMO:     PPO: yes     PCP:      IPA:      80/20:      OTHER: medicare advantage plan PRIMARY: [employer] Medicare      Policy#: [name]      Subscriber: pt CM Name: Tish    Phone#: [phone]     Fax#: [phone] f/u with Kathleene HazelShanon Stacy [phone] approved for 7 days Pre-Cert#: [id number]      Employer: retired Benefits:  Phone #: [phone]     Name: 01/13/15 Eff. Date: 09/12/14     Deduct: $290      Out of Pocket Max: $3290      Life Max: none CIR: 80% for max Cap per admission of $2218    SNF: 80% covered days 1-20 up to $40 copay per day; days 21-100 80% coverage up to cap $160 per day Outpatient: 80%     Co-Pay: no visit limit Home Health: 100%      Co-Pay: no visit limit DME: 80%     Co-Pay: 20% Providers: in network  SECONDARY: none      Medicaid Application Date:       Case Manager:  Disability Application Date:       Case Worker:   Emergency Conservator, museum/galleryContact Information Contact Information    Name Relation Home Work Mobile   GlendalePickard,[name] Spouse [phone]     Mardee PostinICKARD,[name]  [phone]     Marlowe Aschoffrrichiello,Tyra    [phone]   Moser,Tina    [phone]     Current Medical History  Patient Admitting Diagnosis: cardio-embolic CVA  History of Present Illness: Jerome Bowen is a 81 y.o. right handed male with history of hypertension, hyperlipidemia and angina. Patient underwent elective cardiac cath for chest pain with abnormal stress test findings of distal RCA stenosis underwent stenting 01/08/2015 at [facility]. Following the procedure noted to be agitated as well as difficulty speaking. MRI obtained which showed multifocal infarcts in both anterior and posterior circulation consistent with cardiac embolus [facility].  Patient also had developed postoperative hematoma after cardiac catheterization. Did not receive TPA. Echocardiogram with ejection fraction of 70% systolic function was vigorous. MRI of the brain without stenosis. Neurology consulted presently maintained on aspirin and Plavix therapy. Subcutaneous heparin for DVT prophylaxis. Patient is nothing by mouth with nasogastric tube feeds after a failed modified barium swallow.  Rash developed on 01/13/2015 on back. Does not follow a dermatomal pattern and not painful. Possibly felt to be related to heat. Will apply benadryl cream and monitor.   Total: 4 NIH    Past Medical History  Past Medical History  Diagnosis Date  . Essential hypertension   . Dyslipidemia   . CAD (coronary artery disease)   . S/P right coronary artery (RCA) stent placement   . OSA (obstructive sleep apnea)   . Esophageal stricture   . Seronegative arthritis   . Fatty liver     Family History  family history includes Heart disease in his brother.  Prior Rehab/Hospitalizations: none  Current Medications   Current facility-administered medications:  .  acetaminophen (TYLENOL) tablet 650 mg, 650 mg, Per Tube, Q4H PRN, Ripudeep Jenna LuoK Rai, MD .  antiseptic  oral rinse (CPC / CETYLPYRIDINIUM CHLORIDE 0.05%) solution 7 mL, 7 mL, Mouth Rinse, BID, Ripudeep K Rai, MD, 7 mL at 01/14/15 1000 .  aspirin suppository 300 mg, 300 mg, Rectal, Daily, 300 mg at 01/12/15 0954 **OR** aspirin tablet 325 mg, 325 mg, Oral, Daily, Rolly Salter, MD, 325 mg at 01/14/15 1610 .  atorvastatin (LIPITOR) tablet 10 mg, 10 mg, Per Tube, q1800, Ripudeep K Rai, MD, 10 mg at 01/13/15 1703 .  clopidogrel (PLAVIX) tablet 75 mg, 75 mg, Per NG tube, Daily, Rolly Salter, MD, 75 mg at 01/14/15 9604 .  diphenhydrAMINE-zinc acetate (BENADRYL) 2-0.1 % cream, , Topical, TID PRN, Maryann Mikhail, DO .  docusate sodium (COLACE) capsule 100 mg, 100 mg, Oral, BID, Maryann Mikhail, DO, 100 mg at 01/14/15 1000 .   famotidine (PEPCID) tablet 20 mg, 20 mg, Oral, BID, Maryann Mikhail, DO, 20 mg at 01/14/15 5409 .  heparin injection 5,000 Units, 5,000 Units, Subcutaneous, 3 times per day, Rolly Salter, MD, 5,000 Units at 01/14/15 1504 .  RESOURCE THICKENUP CLEAR, , Oral, PRN, Maryann Mikhail, DO .  senna (SENOKOT) tablet 8.6 mg, 1 tablet, Oral, BID, Maryann Mikhail, DO, 8.6 mg at 01/14/15 8119 .  sodium chloride (OCEAN) 0.65 % nasal spray 1 spray, 1 spray, Each Nare, PRN, Rolly Salter, MD  Patients Current Diet: Dysphagia 1 diet with nectar thick liquids. Pills crushed in puree  Precautions / Restrictions Precautions Precautions: Fall Precaution Comments: panda Restrictions Weight Bearing Restrictions: No   Prior Activity Level Community (5-7x/wk): Independent without AD; Driving, active . Retired Sport and exercise psychologist  Very active and independent Optician, dispensing / Corporate investment banker Devices/Equipment: CPAP Home Equipment: None  Prior Functional Level Prior Function Level of Independence: Independent  Current Functional Level Cognition  Overall Cognitive Status: Within Functional Limits for tasks assessed Orientation Level: Oriented X4    Extremity Assessment (includes Sensation/Coordination)  Upper Extremity Assessment: Defer to OT evaluation RUE Deficits / Details: decr fine motor and gross motor. Pt able to hold a pen and write name/ wife name . Pt able to alternate to the L hand and complete task in print with more legible text  Lower Extremity Assessment: RLE deficits/detail RLE Deficits / Details: 4/5 strength grossly graded RLE Sensation: decreased proprioception RLE Coordination: decreased fine motor    ADLs  Overall ADL's : Needs assistance/impaired Eating/Feeding: NPO Eating/Feeding Details (indicate cue type and reason): pt wanting ice chips like at [facility] Grooming: Wash/dry face, Min guard, Sitting Grooming Details (indicate cue type and reason): needed  incr time to avoid panda tube Upper Body Bathing: Moderate assistance, Sitting Upper Body Bathing Details (indicate cue type and reason): difficulty holding with R UE however pt attempting to wrap with L UE. Pt actively using R UE however Lower Body Bathing: Moderate assistance, Sit to/from stand Lower Body Bathing Details (indicate cue type and reason): required sitting . standing for peri care and required (A) for detail to hygiene Upper Body Dressing : Supervision/safety, Sitting Toilet Transfer: +2 for physical assistance, Minimal assistance, Ambulation Toilet Transfer Details (indicate cue type and reason): cues for bil UE to push from chair  Functional mobility during ADLs: +2 for physical assistance, Minimal assistance General ADL Comments: Pt transfered to bathroom for bathing task. pt actively using R Ue with bathing task. pt with LOB with static standing and requires sitting for this time. pt noted to have small blister on back and staff notified     Mobility  Overal  bed mobility: Needs Assistance Bed Mobility: Sit to Supine Rolling: Min assist Supine to sit: Mod assist Sit to supine: Min assist General bed mobility comments: in chair     Transfers  Overall transfer level: Needs assistance Equipment used: 2 person hand held assist Transfers: Sit to/from Stand Sit to Stand: +2 physical assistance, Min assist General transfer comment: Pt with R lateral LOB with static standing. pt encouraged to place feet shoulder width apart. Pt with lateral LOB with head turn to transfer to bathroom. Pt allowed LOB and unable to correct. Pt required total (A) to remain static standing    Ambulation / Gait / Stairs / Wheelchair Mobility  Ambulation/Gait Ambulation/Gait assistance: Mod assist Ambulation Distance (Feet): 60 Feet (x2) Assistive device: 1 person hand held assist General Gait Details: improved ability to faciliate RLE stride and placement this session. Patient able recognize  positioning and postural alignment with less cues this session. Seated rest break regquied Gait Pattern/deviations: Step-through pattern, Decreased stride length, Ataxic, Decreased weight shift to right Gait velocity: decreased Gait velocity interpretation: Below normal speed for age/gender    Posture / Balance Dynamic Sitting Balance Sitting balance - Comments: pt able to static sit EOB  Balance Overall balance assessment: Needs assistance Sitting-balance support: Bilateral upper extremity supported, Feet supported Sitting balance-Leahy Scale: Fair Sitting balance - Comments: pt able to static sit EOB  Postural control: Right lateral lean Standing balance support: Bilateral upper extremity supported, During functional activity Standing balance-Leahy Scale: Poor    Special needs/care consideration BiPAP/CPAP yes pta Bowel mgmt:continent Bladder mgmt: continent    Previous Home Environment Living Arrangements: Spouse/significant other  Lives With: Spouse Available Help at Discharge: Family, Available 24 hours/day Type of Home: House Home Layout: Two level, Able to live on main level with bedroom/bathroom Home Access: Stairs to enter Entrance Stairs-Rails: Right Entrance Stairs-Number of Steps: 2 Home Care Services: No  Discharge Living Setting Plans for Discharge Living Setting: Patient's home, Lives with (comment), Other (Comment) (wife) Type of Home at Discharge: House Discharge Home Layout: Two level, Able to live on main level with bedroom/bathroom Alternate Level Stairs-Rails: Right Discharge Home Access: Stairs to enter Entrance Stairs-Rails: Right Entrance Stairs-Number of Steps: 2 Does the patient have any problems obtaining your medications?: No  Social/Family/Support Systems Patient Roles: Spouse, Parent Contact Information: Mardee Postin, wife Anticipated Caregiver: wife and children Anticipated Caregiver's Contact Information: see above Ability/Limitations of  Caregiver: no limitations Caregiver Availability: 24/7 Discharge Plan Discussed with Primary Caregiver: Yes Is Caregiver In Agreement with Plan?: Yes Does Caregiver/Family have Issues with Lodging/Transportation while Pt is in Rehab?: No (wife or daughters stay with pt 24/7 in hospital)  Goals/Additional Needs Patient/Family Goal for Rehab: supervision with PT, OT, and SLP Expected length of stay: ELOS 10- 16 days Dietary Needs: NPO with panda feeds Pt/Family Agrees to Admission and willing to participate: Yes Program Orientation Provided & Reviewed with Pt/Caregiver Including Roles  & Responsibilities: Yes  Decrease burden of Care through IP rehab admission: n/a  Possible need for SNF placement upon discharge:not anticipated  Patient Condition: This patient's medical and functional status has changed since the consult dated: 5/2/2016in which the Rehabilitation Physician determined and documented that the patient's condition is appropriate for intensive rehabilitative care in an inpatient rehabilitation facility. See "History of Present Illness" (above) for medical update. Functional changes are: mod assist. Patient's medical and functional status update has been discussed with the Rehabilitation physician and patient remains appropriate for inpatient rehabilitation. Will admit to inpatient rehab  today.  Preadmission Screen Completed By:  Clois Dupes, 01/14/2015 5:06 PM ______________________________________________________________________   Discussed status with Dr. Riley Kill on 01/14/15 at  1705 and received telephone approval for admission today.  Admission Coordinator:  Clois Dupes, time 7829 Date 01/14/2015.

## 2015-01-13 NOTE — Progress Notes (Signed)
Patient refused CPAP for the night  

## 2015-01-13 NOTE — Progress Notes (Signed)
Speech Language Pathology Treatment: Dysphagia;Cognitive-Linquistic  Patient Details Name: Jerome Bowen MRN: [id number] DOB: 09-10-1934 Today's Date: 01/13/2015 Time: [id number] SLP Time Calculation (min) (ACUTE ONLY): 12 min  Assessment / Plan / Recommendation Clinical Impression  Pt has improved intelligibility of speech today, requiring Min cues for slow rate and over articulation during conversational speech. SLP administered PO trials of ice chips, thin liquids, and honey thick liquids with no overt coughing noted. Minimal anterior loss observed with thin liquids, along with multiple swallows. No overt signs of aspiration or dysphagia is observed with spoonfuls of honey thick liquids. Recommend repeat MBS to objectively assess for PO readiness.   HPI Other Pertinent Information: Jerome DanasJack L Hargraves is a 81 y.o. male with past medical history of hypertension, dyslipidemia, coronary artery disease, obstructive sleep apnea on C Pap, seronegative arthritis. The patient was brought in from [facility].   Pertinent Vitals Pain Assessment: No/denies pain  SLP Plan  MBS    Recommendations Diet recommendations: NPO Medication Administration: Via alternative means       Oral Care Recommendations: Oral care QID Follow up Recommendations: Inpatient Rehab Plan: MBS    Maxcine HamLaura Paiewonsky, M.A. CCC-SLP [phone]  [name], Dejan Angert 01/13/2015, 11:05 AM

## 2015-01-13 NOTE — Progress Notes (Signed)
Physical Therapy Treatment Patient Details Name: Jerome Bowen MRN: [id number] DOB: March 21, 1934 Today's Date: 01/13/2015    History of Present Illness 81 yo male admitted from [facility] after elective angiography undergoing PCI with drug eluting stent for RCA obstruction. MRI (+) acute bil infarcts brainstem bil occipital infarcts punctate L parietal lobe punctate posterior R frontal infarct  PMH: L eye CVA only use of Lower two quadrants,  CAD sleep apnea and HTN    PT Comments    Patient making notable progress with therapies this session. Improved recognition of positioning and postural alignment during gait re-training. Able to mobilize with 1 person assist (moderate physical assist via body supported and gait belt required). Patient with good carry over of instructions and cues from previous session. Will continue to see and progress as tolerated.   Follow Up Recommendations  CIR;Supervision/Assistance - 24 hour     Equipment Recommendations  Rolling walker with 5" wheels    Recommendations for Other Services Rehab consult     Precautions / Restrictions Precautions Precautions: Fall Precaution Comments: panda Restrictions Weight Bearing Restrictions: No    Mobility  Bed Mobility Overal bed mobility: Needs Assistance Bed Mobility: Sit to Supine       Sit to supine: Min assist   General bed mobility comments: Assist for positioning, VCs for alignment upon return to bed  Transfers Overall transfer level: Needs assistance Equipment used: 1 person hand held assist Transfers: Sit to/from Stand Sit to Stand: Min assist         General transfer comment: VCs for hand placement and positioning at chairs' edge. Able to power up with minimal assist for stability. Cues to create stance to increase BOS  Ambulation/Gait Ambulation/Gait assistance: Mod assist Ambulation Distance (Feet): 60 Feet (x2) Assistive device: 1 person hand held assist Gait Pattern/deviations:  Step-through pattern;Decreased stride length;Ataxic;Decreased weight shift to right Gait velocity: decreased Gait velocity interpretation: Below normal speed for age/gender General Gait Details: improved ability to faciliate RLE stride and placement this session. Patient able recognize positioning and postural alignment with less cues this session. Seated rest break regquied   Information systems manager Rankin (Stroke Patients Only)       Balance Overall balance assessment: Needs assistance Sitting-balance support: Bilateral upper extremity supported;Feet supported Sitting balance-Leahy Scale: Fair Sitting balance - Comments: pt able to static sit EOB    Standing balance support: Bilateral upper extremity supported;During functional activity Standing balance-Leahy Scale: Poor                      Cognition Arousal/Alertness: Awake/alert Behavior During Therapy: WFL for tasks assessed/performed Overall Cognitive Status: Within Functional Limits for tasks assessed                      Exercises      General Comments        Pertinent Vitals/Pain Pain Assessment: No/denies pain    Home Living                      Prior Function            PT Goals (current goals can now be found in the care plan section) Acute Rehab PT Goals Patient Stated Goal: to get better PT Goal Formulation: With patient Time For Goal Achievement: 01/24/15 Potential to Achieve Goals: Good Progress towards PT goals: Progressing toward goals  Frequency  Min 4X/week    PT Plan Current plan remains appropriate    Co-evaluation             End of Session Equipment Utilized During Treatment: Gait belt Activity Tolerance: Patient tolerated treatment well Patient left: in bed;with call bell/phone within reach;with family/visitor present     Time: 1100-1116 PT Time Calculation (min) (ACUTE ONLY): 16 min  Charges:  $Gait  Training: 8-22 mins                    G CodesFabio Asa:      Elsa Ploch J 01/13/2015, 11:37 AM Charlotte Crumbevon Shermeka Rutt, PT DPT  [phone]

## 2015-01-13 NOTE — Progress Notes (Signed)
Nutrition Follow-up  INTERVENTION:  Magic cup TID with meals  RD to monitor PO intake for adequacy  NUTRITION DIAGNOSIS:  Predicted suboptimal nutrient intake related to dysphagia as evidenced by meal completion < 50%.   GOAL:  Patient will meet greater than or equal to 90% of their needs  Not met  MONITOR:  PO intake, Labs, Weight trends, TF tolerance  ASSESSMENT: 81 y.o. male with Past medical history of hypertension, dyslipidemia, coronary artery disease, obstructive sleep apnea on C Pap, seronegative arthritis. The patient was recently hospitalized for elective angiography after an abnormal stress test. During the procedure the patient become agitated and was acting confused. Due to persistent aphasia and confusion and MRI was up pain which was positive for multifocal strokes.  Pt was re-assessed by SLP today and determined to tolerate Dysphagia 1 diet and nectar-thick liquids. NGT was removed and diet was advanced for lunch- pt ate 50%. Pt states that it was difficult to eat and taste isn't quite right. He denies nausea or abdominal pain. Wife reports that pt was required to eat during SLP assessment which may be why he ate less. Encouraged intake of Magic Cup ice cream on meals trays until PO intake returns to normal.  Height:  Ht Readings from Last 1 Encounters:  01/09/15 5' 8" (1.727 m)    Weight:  Wt Readings from Last 1 Encounters:  01/13/15 180 lb 5.4 oz (81.8 kg)    Ideal Body Weight:  70 kg  Wt Readings from Last 10 Encounters:  01/13/15 180 lb 5.4 oz (81.8 kg)  01/09/15 179 lb 14.4 oz (81.602 kg)    BMI:  Body mass index is 27.43 kg/(m^2).  Estimated Nutritional Needs: Kcal:  1900-2100  Protein:   95-110 grams  Fluid:  1.9-2.1 L/day  Skin:  Reviewed, no issues  Diet Order:  DIET - DYS 1 Room service appropriate?: Yes; Fluid consistency:: Nectar Thick    Intake/Output Summary (Last 24 hours) at 01/13/15 1638 Last data filed at 01/13/15  1400  Gross per 24 hour  Intake    120 ml  Output      0 ml  Net    120 ml    Last BM:  5/1  Reanne Barnett RD, LDN Inpatient Clinical Dietitian Pager: [phone] After Hours Pager: [phone]  

## 2015-01-13 NOTE — Progress Notes (Signed)
I met with pt, his wife, and daughter at bedside. We discussed an inpt rehab stay pending insurance approval today which I have initiated. OT noted back rash while completing adls. I will contact Dr. Ree Kida to notify of rash and intentions for admission. I will follow up today. [phone]

## 2015-01-13 NOTE — Progress Notes (Signed)
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 Pt actively using R UE during task. Pt noted to have small blisters on R side of trunk posteriorly. Staff notified. Pt reports no pain or awareness to the blisters. Pt very pleased with bathing at sink level this session. Pt remains excellent CIR candidate.   Follow Up Recommendations  CIR    Equipment Recommendations  Other (comment)    Recommendations for Other Services Rehab consult    Precautions / Restrictions Precautions Precautions: Fall Precaution Comments: panda Restrictions Weight Bearing Restrictions: No       Mobility Bed Mobility Overal bed mobility: Needs Assistance Bed Mobility: Sit to Supine       Sit to supine: Min assist   General bed mobility comments: in chair   Transfers Overall transfer level: Needs assistance Equipment used: 2 person hand held assist Transfers: Sit to/from Stand Sit to Stand: +2 physical assistance;Min assist         General transfer comment: Pt with R lateral LOB with static standing. pt encouraged to place feet shoulder width apart. Pt with lateral LOB with head turn to transfer to bathroom. Pt allowed LOB and unable to correct. Pt required total (A) to remain static standing    Balance Overall balance assessment: Needs assistance Sitting-balance support: Bilateral upper extremity supported;Feet supported Sitting balance-Leahy Scale: Fair Sitting balance - Comments: pt able to static sit EOB    Standing balance support: Bilateral upper extremity supported;During functional activity Standing balance-Leahy Scale: Poor                     ADL Overall ADL's : Needs assistance/impaired     Grooming: Wash/dry face;Min guard;Sitting Grooming Details (indicate cue type and reason): needed incr time to avoid panda tube Upper Body Bathing: Moderate assistance;Sitting Upper Body Bathing Details (indicate cue type and reason): difficulty holding with R UE however pt attempting to wrap with L UE. Pt actively using R UE however Lower Body Bathing: Moderate assistance;Sit to/from stand Lower Body Bathing Details (indicate cue type and reason): required sitting . standing for peri care and required (A) for detail to hygiene Upper Body Dressing : Supervision/safety;Sitting       Toilet Transfer: +2 for physical assistance;Minimal assistance;Ambulation Toilet Transfer Details (indicate cue type and reason): cues for bil UE to push from chair          Functional mobility during ADLs: +2 for physical assistance;Minimal assistance General ADL Comments: Pt transfered to bathroom for bathing task. pt actively using R Ue with bathing task. pt with LOB with static standing and requires sitting for this time. pt noted to have small blister on back and staff notified       Vision                 Additional Comments: wearing eye patch   Perception     Praxis      Cognition   Behavior During Therapy: [facility] for tasks assessed/performed Overall Cognitive Status: Within Functional  Limits for tasks assessed                       Extremity/Trunk Assessment               Exercises     Shoulder Instructions       General Comments      Pertinent Vitals/ Pain       Pain Assessment: No/denies pain  Home Living                                          Prior Functioning/Environment              Frequency Min 3X/week     Progress  Toward Goals  OT Goals(current goals can now be found in the care plan section)  Progress towards OT goals: Progressing toward goals  Acute Rehab OT Goals Patient Stated Goal: to get better OT Goal Formulation: With patient/family Time For Goal Achievement: 01/24/15 Potential to Achieve Goals: Good ADL Goals Pt Will Perform Grooming: with supervision;sitting Pt Will Perform Upper Body Bathing: with min guard assist;sitting Pt Will Transfer to Toilet: with min assist;ambulating;bedside commode Additional ADL Goal #1: Pt will don eye patch with mod (A)  (MET) Additional ADL Goal #2: Pt will complete bed mobility Supervision level with HOB < 20 degrees   Plan Discharge plan remains appropriate    Co-evaluation                 End of Session Equipment Utilized During Treatment: Gait belt   Activity Tolerance Patient tolerated treatment well   Patient Left in chair;with call bell/phone within reach;with family/visitor present   Nurse Communication Mobility status;Precautions        Time: 7125-2712 OT Time Calculation (min): 26 min  Charges: OT General Charges $OT Visit: 1 Procedure OT Treatments $Self Care/Home Management : 23-37 mins $Therapeutic Activity: 8-22 mins  Peri Maris 01/13/2015, 11:42 AM  Pager: [phone]

## 2015-01-13 NOTE — Progress Notes (Signed)
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 Wife and patient appreciated OT assist for OOB at this time. Pt and wife encouraged to keep advocating for mobility from hospital staff.    Follow Up Recommendations  CIR    Equipment Recommendations  Other (comment)    Recommendations for Other Services Rehab consult    Precautions / Restrictions Precautions Precautions: Fall Precaution Comments: panda       Mobility Bed Mobility Overal bed mobility: Needs Assistance Bed Mobility: Supine to Sit Rolling: Min guard   Supine to sit: Min guard     General bed mobility comments: HOB elevated due to panda at this time. Pt using bed rail and required incr time  Transfers Overall transfer level: Needs assistance Equipment used: 1 person hand held assist Transfers: Sit to/from Stand Sit to Stand: Min assist         General transfer comment: Pt pushing with BIL UE and able to power up this session. pt motivated to get OOB to chair. pt with hand held (A) and cues for weight shift. Pt side step to chair with mod (A). pt demontrastes lateral lean to the R    Balance Overall balance assessment: Needs assistance Sitting-balance support: Bilateral upper extremity supported;Feet supported Sitting balance-Leahy Scale: Fair Sitting balance - Comments: pt able to static sit EOB    Standing balance support: Bilateral upper extremity supported;During functional activity Standing balance-Leahy Scale: Poor                      ADL                                         General ADL Comments: Pt and wife requesting OOB to chair transfer. OT completing task and agree to (A). OT informed patient of next session focused on bathroom level adls      Vision                 Additional Comments: pt wearing patch over L eye and adjusting in prep for OOB   Perception     Praxis      Cognition   Behavior During Therapy: [facility] for tasks assessed/performed Overall Cognitive Status: Within Functional Limits for tasks assessed                       Extremity/Trunk Assessment               Exercises Other Exercises Other Exercises: pt observed using R UE with LUE to on [place] on arrival. Pt demonstrates improved active movement of R UE compared to previous session   Shoulder Instructions       General Comments      Pertinent Vitals/ Pain       Pain Assessment: No/denies pain  Home Living  Prior Functioning/Environment              Frequency Min 3X/week     Progress Toward Goals  OT Goals(current goals can now be found in the care plan section)  Progress towards OT goals: Progressing toward goals  Acute Rehab OT Goals Patient Stated Goal: to get better OT Goal Formulation: With patient/family Time For Goal Achievement: 01/24/15 Potential to Achieve Goals: Good ADL Goals Pt Will Perform Grooming: sitting;with mod assist Pt Will Perform Upper Body Bathing: with min assist;bed level Pt Will Transfer to Toilet: with +2 assist;with mod assist;bedside commode Additional ADL Goal #1: Pt will don eye patch with mod (A)  Additional ADL Goal #2: Pt will complete bed mobility mod (A) with hob elevated and bed rails  Plan Discharge plan remains appropriate    Co-evaluation                 End of Session Equipment Utilized During Treatment: Gait belt   Activity Tolerance Patient  tolerated treatment well   Patient Left in chair;with call bell/phone within reach;with family/visitor present   Nurse Communication Mobility status;Precautions        Time: 8657-84690824-0832 OT Time Calculation (min): 8 min  Charges: OT General Charges $OT Visit: 1 Procedure OT Treatments $Therapeutic Activity: 8-22 mins  Boone MasterJones, Adelyne Marchese B 01/13/2015, 11:16 AM  Pager: [phone]

## 2015-01-13 NOTE — Progress Notes (Signed)
[facility]                                                                              Patient Demographics  Jerome Bowen, is a 81 y.o. male, DOB - 10-May-1934, ZOX:[id number]  Admit date - 01/09/2015   Admitting Physician Joseph Art, DO  Outpatient Primary MD for the patient is No PCP Per Patient  LOS - 4   No chief complaint on file.     HPI on 01/09/2015 by Dr. Lynden Oxford Jerome Bowen is a 81 y.o. male with Past medical history of hypertension, dyslipidemia, coronary artery disease, obstructive sleep apnea on C Pap, seronegative arthritis. The patient was brought in from [facility]. The patient was recently hospitalized for elective angiography after an abnormal stress test. The patient was having progressively worsening chest pain prior to the stress test. The angiography showed that the patient had an RCA obstruction and patient underwent PCI with drug-eluting stent. During the procedure the patient become agitated and was acting confused. Initially it was thought that the patient had a reaction to Versed. Patient also developed a small hematoma at the groin. Patient was placed on tirofiban overnight per protocol. A CT of the head initially did not reveal any acute abnormality. Due to persistent aphasia and confusion and MRI was up pain which was positive for multifocal strokes. Carotid Doppler as well as echo program was obtained and the patient was sent here for further workup. The patient is coming from hospital. And at his baseline independent for most of his ADL.  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 Reconstitution of left vertebral artery at the skull base supplying left PICA and giving minimal contribution to basilar-no other stenosis noted -2-D echo: EF 65-70% -Carotid Doppler showed very minimal amount of bilateral intimal wall thickening and atherosclerotic plaque right relatively greater than left but no hemodynamically significant stenosis -PT, OT recommending CIR-consult has been placed -Currently on aspirin and Plavix  -A1c 5.4, LDL 93  Dysphagia -Secondary to CVA -Currently NPO, failed swallow testing, has PANDA tube- have requested dietitian consult to start tube feeds via Panda tube -Neurology recommending to wait another 2-3 days to see if there is improvement in swallowing-if not, a PEG tube will need to be placed prior to discharge from the hospital -Continue tube feeds and will obtain swallow study -Speech therapy following  CAD with recent RCA drug-eluting stent -Continue with aspirin and Plavix  Essential hypertension -Stable -Losartan initially held due to CVA  Dyslipidemia - LDL 93 placed on Lipitor  Rash -Patient developed rash on back- does not follow a dermatomal pattern, therefore unlikely shingles -Rash is not painful -Possibly related to heat -Will continue to monitor and consult wound care -Will order benadryl cream  Code Status: Full  Family Communication: Wife at bedside  Disposition Plan: Admitted.  Pending CIR-  likely 01/14/2015   Time Spent in minutes   30 minutes  Procedures  Echocardiogram Carotid doppler  Consults   Neurology  DVT Prophylaxis  Heparin  Lab Results  Component Value Date   PLT 211 01/10/2015    Medications  Scheduled Meds: . antiseptic oral rinse  7 mL Mouth Rinse BID  . aspirin  300 mg Rectal Daily   Or  . aspirin  325 mg Oral Daily  . atorvastatin  10 mg Per Tube q1800  . clopidogrel  75 mg Per NG tube Daily  . famotidine (PEPCID) IV  20 mg Intravenous Q12H  . free water  110  mL Per Tube Q4H  . heparin  5,000 Units Subcutaneous 3 times per day   Continuous Infusions: . sodium chloride 75 mL/hr at 01/13/15 0103  . feeding supplement (JEVITY 1.2 CAL) 1,000 mL (01/12/15 1430)   PRN Meds:.acetaminophen, sodium chloride  Antibiotics    Anti-infectives    None      Subjective:   Jerome Bowen seen and examined today.  Patient states he is feeling better and stronger.  Speech more clear than yesterday- per wife.  He denies chest pain, shortness of breath, abdominal pain.   Objective:   Filed Vitals:   01/13/15 0149 01/13/15 0500 01/13/15 0635 01/13/15 1012  BP: 138/63  135/59 150/62  Pulse: 71  80 77  Temp: 99 F (37.2 C)  97.6 F (36.4 C) 98 F (36.7 C)  TempSrc: Axillary  Oral Oral  Resp: Weight:  81.8 kg (180 lb 5.4 oz)    SpO2: 100%  97% 98%    Wt Readings from Last 3 Encounters:  01/13/15 81.8 kg (180 lb 5.4 oz)  01/09/15 81.602 kg (179 lb 14.4 oz)    No intake or output data in the 24 hours ending 01/13/15 1219  Exam  General: Well developed, well nourished, no distress  HEENT: NCAT,  mucous membranes moist. NGtube inp lace  Cardiovascular: S1 S2 auscultated, no murmurs, RRR  Respiratory: Clear to auscultation  Abdomen: Soft, nontender, nondistended, + bowel sounds  Extremities: warm dry without cyanosis clubbing or edema  Skin: 3 small blisters noted on right sided of back, warm to touch, not tener  Neuro: AAOx3, speech and strength improving  Data Review   Micro Results No results found for this or any previous visit (from the past 240 hour(s)).  Radiology Reports Ct Abdomen Pelvis Wo Contrast  01/08/2015   CLINICAL DATA:  Status post cardiac catheterization. Swelling in the right lower quadrant. Possible hematoma.  EXAM: CT ABDOMEN AND PELVIS WITHOUT CONTRAST  TECHNIQUE: Multidetector CT imaging of the abdomen and pelvis was performed following the standard protocol without IV contrast.  COMPARISON:  None.   FINDINGS: The lung bases appear emphysematous but are clear. No pleural or pericardial effusion is identified.  Contrast material from the patient's cardiac catheterization is seen in the kidneys and urinary bladder. Urinary bladder contrast creates streak artifact in the pelvis. Hemorrhage is seen in the right groin which is not confluent and measures approximately 5.7 cm AP x 8.7 cm transverse by at least 10 cm craniocaudal. Hematoma is also identified in the right pelvis measuring approximately 5.1 cm AP by 3.6 cm transverse by 7.2 cm craniocaudal.  The gallbladder, spleen, adrenal glands, pancreas and kidneys are unremarkable. A tiny low attenuating lesion in the left hepatic lobe anteriorly and near the dome is likely a cyst. The liver is otherwise unremarkable. No lymphadenopathy  is seen. Scattered aortoiliac atherosclerosis without aneurysm is noted. The stomach small bowel and appendix appear normal.  There is a superior endplate compression fracture of L2 with vertebral body height loss of approximately 40%. The fracture appears remote but cannot be definitively characterized. No lytic or sclerotic bony lesion is seen.  IMPRESSION: The study is positive for a moderate sized hematoma in the right groin extending into the right pelvis as described above. No other acute abnormality is identified.  Aortoiliac atherosclerosis without aneurysm.  L2 superior endplate compression fracture appears remote but cannot be definitively characterized.   Electronically Signed   By: Drusilla Kanner M.D.   On: 01/08/2015 10:38   Jerome Angiogram Neck W Wo Contrast  01/10/2015   CLINICAL DATA:  Acute brainstem infarction.  EXAM: MRA NECK WITHOUT AND WITH CONTRAST  MRA HEAD WITHOUT CONTRAST  TECHNIQUE: Multiplanar and multiecho pulse sequences of the neck were obtained without and with intravenous contrast. Angiographic images of the neck were obtained using MRA technique without and with intravenous contast.; Angiographic  images of the Circle of Willis were obtained using MRA technique without intravenous contrast.  CONTRAST:  20mL MULTIHANCE GADOBENATE DIMEGLUMINE 529 MG/ML IV SOLN  COMPARISON:  MRI 01/09/2015.  Ultrasound 01/09/2015.  FINDINGS: MRA NECK FINDINGS  Branching pattern of the brachiocephalic vessels from the arch is normal. No origin stenoses.  Both common carotid arteries are widely patent to their respective bifurcation. Both carotid bifurcations are normal without narrowing or irregularity. Both cervical internal carotid arteries are tortuous but widely patent an otherwise normal.  The right vertebral artery is patent at its origin. I think there is narrowing on the order of 30%. Beyond that, the vessel is widely patent through the cervical region.  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 There does not appear to be any reconstitution until the skullbase level where there is probably minor reconstitution of the distal left vertebral artery, supplying at least left PICA. There may be minimal reconstituted flow contributed to the basilar.  MRA HEAD FINDINGS  Both internal carotid arteries are widely patent through the skullbase. No siphon stenosis. The anterior and middle cerebral vessels are patent without proximal stenosis, aneurysm or vascular malformation. The right PCA takes a fetal origin from the anterior circulation. There is a large posterior communicating artery on the left, but with some communication with the distal basilar.  The right vertebral artery is a large vessel that supplies the basilar. No basilar stenosis. No antegrade flow is seen in the left vertebral artery, though this study is not as sensitive as information gleaned from the contrast-enhanced study, which showed some reconstitution of the distal left vertebral artery by cervical collaterals at the skullbase, supplying the left PICA and with minimal contribution to the basilar. Both superior cerebellar arteries show flow. As noted  previously, the right PCA receives it supply from the anterior circulation. The left PCA receives it supply from both the basilar tip and the anterior circulation.  IMPRESSION: No anterior circulation disease identified.  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 Reconstitution of the distal left vertebral artery at the skullbase level, supplying left PICA and giving minimal contribution to the basilar.  No basilar stenosis. Superior cerebellar and posterior cerebral arteries show flow presently.   Electronically Signed   By: Paulina Fusi M.D.   On: 01/10/2015 11:48   US Carotid Bilateral  01/09/2015   CLINICAL DATA:  History of stroke, hypertension, CAD, visual disturbance, hyperlipidemia.  EXAM: BILATERAL CAROTID DUPLEX  ULTRASOUND  TECHNIQUE: Wallace Cullens scale imaging, color Doppler and duplex ultrasound were performed of bilateral carotid and vertebral arteries in the neck.  COMPARISON:  Head CT- 01/08/2015; 12/19/2014  FINDINGS: Criteria: Quantification of carotid stenosis is based on velocity parameters that correlate the residual internal carotid diameter with NASCET-based stenosis levels, using the diameter of the distal internal carotid lumen as the denominator for stenosis measurement.  The following velocity measurements were obtained:  RIGHT  ICA:  104/15 cm/sec  CCA:  133/18 cm/sec  SYSTOLIC ICA/CCA RATIO:  0.8  DIASTOLIC ICA/CCA RATIO:  0.9  ECA:  95 cm/sec  LEFT  ICA:  115/21 cm/sec  CCA:  134/17 cm/sec  SYSTOLIC ICA/CCA RATIO:  0.9  DIASTOLIC ICA/CCA RATIO:  1.3  ECA:  115 cm/sec  RIGHT CAROTID ARTERY: There is a minimal amount of eccentric mixed echogenic plaque within the right carotid bulb (image 12), extending to involve the origin and proximal aspects of the right internal carotid artery (image 18), not resulting in elevated peak systolic velocities within the interrogated course of the right internal carotid artery to suggest a hemodynamically significant stenosis.  RIGHT VERTEBRAL ARTERY:   Antegrade flow  LEFT CAROTID ARTERY: There is a very minimal amount of intimal wall thickening involving the origin and proximal aspects of the left internal carotid artery (image 49), not resulting in elevated peak systolic velocities within the interrogated course of the left internal carotid artery to suggest a hemodynamically significant stenosis.  LEFT VERTEBRAL ARTERY:  Antegrade flow  IMPRESSION: Very minimal amount of bilateral intimal wall thickening and atherosclerotic plaque, right subjectively greater than left, not resulting in a hemodynamically significant stenosis.   Electronically Signed   By: Simonne Come M.D.   On: 01/09/2015 10:43   Dg Abd Portable 1v  01/10/2015   CLINICAL DATA:  Nasogastric tube placement.  Esophageal stricture.  EXAM: PORTABLE ABDOMEN - 1 VIEW  COMPARISON:  None.  FINDINGS: Feeding tube is identified within the proximal stomach. Mild motion degradation. Nonobstructive bowel gas pattern. Mid and lower pelvis excluded.  IMPRESSION: Feeding tube terminating at the proximal stomach.   Electronically Signed   By: Jeronimo Greaves M.D.   On: 01/10/2015 00:19   Dg Swallowing Func-speech Pathology  01/10/2015    Objective Swallowing Evaluation:    Patient Details  Name: Jerome Bowen MRN: [id number] Date of Birth: [birth year]  Today's Date: 01/10/2015 Time: SLP Start Time (ACUTE ONLY): 1430-SLP Stop Time (ACUTE ONLY): 1500 SLP Time Calculation (min) (ACUTE ONLY): 30 min  Past Medical History:  Past Medical History  Diagnosis Date  . Essential hypertension   . Dyslipidemia   . CAD (coronary artery disease)   . S/P right coronary artery (RCA) stent placement   . OSA (obstructive sleep apnea)   . Esophageal stricture   . Seronegative arthritis   . Fatty liver    Past Surgical History: No past surgical history on file. HPI:  Other Pertinent Information: Jerome Bowen is a 81 y.o. male with past  medical history of hypertension, dyslipidemia, coronary artery disease,  obstructive sleep apnea  on C Pap, seronegative arthritis. The patient was  brought in from [facility].  No Data Recorded  Assessment / Plan / Recommendation CHL IP CLINICAL IMPRESSIONS 01/10/2015  Therapy Diagnosis Severe oral phase dysphagia;Moderate pharyngeal phase  dysphagia  Clinical Impression MBSS completed.  Pt presents with severe oral and  moderate pharyngeal dysphagia c/b severe oral discoordination and poor a/p  transit of the bolus.  The pt  was unable to adequately manipulate thins or  purees.  The entire thin bolus was anteriorly lost and the pt was unable  to move the puree bolus into the pharynx requiring oral suctioning.   Pharyngeally the pt presented with a delayed swallow trigger, decreased  constriction with mild vallecular and pyriform residue and decreased  epiglottic deflection with tsp boluses of honey.  Given the pt's current  swallowing ability rx keep the pt NPO.  Rx therapeutic feeds of honey  liquids via tsp sips with the SLP only.  The pt will require intense ST  f/u at the next level of care.        CHL IP TREATMENT RECOMMENDATION 01/10/2015  Treatment Recommendations Therapy as outlined in treatment plan below     CHL IP DIET RECOMMENDATION 01/10/2015  SLP Diet Recommendations NPO  Liquid Administration via (None)  Medication Administration Via alternative means  Compensations (None)  Postural Changes and/or Swallow Maneuvers (None)     CHL IP OTHER RECOMMENDATIONS 01/10/2015  Recommended Consults (None)  Oral Care Recommendations Oral care BID  Other Recommendations (None)     CHL IP FOLLOW UP RECOMMENDATIONS 01/10/2015  Follow up Recommendations Inpatient Rehab     CHL IP FREQUENCY AND DURATION 01/10/2015  Speech Therapy Frequency (ACUTE ONLY) min 2x/week  Treatment Duration 2 weeks         CHL IP REASON FOR REFERRAL 01/10/2015  Reason for Referral Objectively evaluate swallowing function     CHL IP ORAL PHASE 01/10/2015  Lips (None)  Tongue (None)  Mucous membranes (None)  Nutritional status (None)  Other  (None)  Oxygen therapy (None)  Oral Phase Impaired  Oral - Pudding Teaspoon (None)  Oral - Pudding Cup (None)  Oral - Honey Teaspoon (None)  Oral - Honey Cup (None)  Oral - Honey Syringe (None)  Oral - Nectar Teaspoon (None)  Oral - Nectar Cup (None)  Oral - Nectar Straw (None)  Oral - Nectar Syringe (None)  Oral - Ice Chips (None)  Oral - Thin Teaspoon (None)  Oral - Thin Cup (None)  Oral - Thin Straw (None)  Oral - Thin Syringe (None)  Oral - Puree (None)  Oral - Mechanical Soft (None)  Oral - Regular (None)  Oral - Multi-consistency (None)  Oral - Pill (None)  Oral Phase - Comment (None)      CHL IP PHARYNGEAL PHASE 01/10/2015  Pharyngeal Phase Impaired  Pharyngeal - Pudding Teaspoon (None)  Penetration/Aspiration details (pudding teaspoon) (None)  Pharyngeal - Pudding Cup (None)  Penetration/Aspiration details (pudding cup) (None)  Pharyngeal - Honey Teaspoon (None)  Penetration/Aspiration details (honey teaspoon) (None)  Pharyngeal - Honey Cup (None)  Penetration/Aspiration details (honey cup) (None)  Pharyngeal - Honey Syringe (None)  Penetration/Aspiration details (honey syringe) (None)  Pharyngeal - Nectar Teaspoon (None)  Penetration/Aspiration details (nectar teaspoon) (None)  Pharyngeal - Nectar Cup (None)  Penetration/Aspiration details (nectar cup) (None)  Pharyngeal - Nectar Straw (None)  Penetration/Aspiration details (nectar straw) (None)  Pharyngeal - Nectar Syringe (None)  Penetration/Aspiration details (nectar syringe) (None)  Pharyngeal - Ice Chips (None)  Penetration/Aspiration details (ice chips) (None)  Pharyngeal - Thin Teaspoon (None)  Penetration/Aspiration details (thin teaspoon) (None)  Pharyngeal - Thin Cup (None)  Penetration/Aspiration details (thin cup) (None)  Pharyngeal - Thin Straw (None)  Penetration/Aspiration details (thin straw) (None)  Pharyngeal - Thin Syringe (None)  Penetration/Aspiration details (thin syringe') (None)  Pharyngeal - Puree (None)  Penetration/Aspiration  details (puree) (None)  Pharyngeal - Mechanical Soft (None)  Penetration/Aspiration details (mechanical soft) (None)  Pharyngeal - Regular (None)  Penetration/Aspiration details (regular) (None)  Pharyngeal - Multi-consistency (None)  Penetration/Aspiration details (multi-consistency) (None)  Pharyngeal - Pill (None)  Penetration/Aspiration details (pill) (None)  Pharyngeal Comment (None)             Fleet Contras 01/10/2015, 3:26 PM  Dimas Aguas, MA, CCC-SLP Acute Rehab SLP [phone]]     Jerome Bowen Head/brain Wo Cm  01/10/2015   CLINICAL DATA:  Acute brainstem infarction.  EXAM: MRA NECK WITHOUT AND WITH CONTRAST  MRA HEAD WITHOUT CONTRAST  TECHNIQUE: Multiplanar and multiecho pulse sequences of the neck were obtained without and with intravenous contrast. Angiographic images of the neck were obtained using MRA technique without and with intravenous contast.; Angiographic images of the Circle of Willis were obtained using MRA technique without intravenous contrast.  CONTRAST:  20mL MULTIHANCE GADOBENATE DIMEGLUMINE 529 MG/ML IV SOLN  COMPARISON:  MRI 01/09/2015.  Ultrasound 01/09/2015.  FINDINGS: MRA NECK FINDINGS  Branching pattern of the brachiocephalic vessels from the arch is normal. No origin stenoses.  Both common carotid arteries are widely patent to their respective bifurcation. Both carotid bifurcations are normal without narrowing or irregularity. Both cervical internal carotid arteries are tortuous but widely patent an otherwise normal.  The right vertebral artery is patent at its origin. I think there is narrowing on the order of 30%. Beyond that, the vessel is widely patent through the cervical region.  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 There does not appear to be any reconstitution until the skullbase level where there is probably minor reconstitution of the distal left vertebral artery, supplying at least left PICA. There may be minimal reconstituted flow contributed to the  basilar.  MRA HEAD FINDINGS  Both internal carotid arteries are widely patent through the skullbase. No siphon stenosis. The anterior and middle cerebral vessels are patent without proximal stenosis, aneurysm or vascular malformation. The right PCA takes a fetal origin from the anterior circulation. There is a large posterior communicating artery on the left, but with some communication with the distal basilar.  The right vertebral artery is a large vessel that supplies the basilar. No basilar stenosis. No antegrade flow is seen in the left vertebral artery, though this study is not as sensitive as information gleaned from the contrast-enhanced study, which showed some reconstitution of the distal left vertebral artery by cervical collaterals at the skullbase, supplying the left PICA and with minimal contribution to the basilar. Both superior cerebellar arteries show flow. As noted previously, the right PCA receives it supply from the anterior circulation. The left PCA receives it supply from both the basilar tip and the anterior circulation.  IMPRESSION: No anterior circulation disease identified.  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 Reconstitution of the distal left vertebral artery at the skullbase level, supplying left PICA and giving minimal contribution to the basilar.  No basilar stenosis. Superior cerebellar and posterior cerebral arteries show flow presently.   Electronically Signed   By: Paulina Fusi M.D.   On: 01/10/2015 11:48   Ct Head Limited W/o Cm  01/08/2015   CLINICAL DATA:  Cardiac catheterization today. Altered mental status.  EXAM: CT HEAD WITHOUT CONTRAST  TECHNIQUE: Contiguous axial images were obtained from the base of the skull through the vertex without intravenous contrast.  COMPARISON:  None.  FINDINGS: Vascular contrast is identified from preceding cardiac catheterization.  Mild atrophy, appropriate for age.  Negative for hydrocephalus.  Negative for acute or  chronic  ischemia. Negative for hemorrhage or mass  Normal enhancement.  Calvarium intact.  IMPRESSION: Age related atrophy.  No acute abnormality.  These results will be called to the ordering clinician or representative by the Radiologist Assistant, and communication documented in the PACS or zVision Dashboard.   Electronically Signed   By: Marlan Palau M.D.   On: 01/08/2015 10:53   Jerome Brain Ltd W/o Cm  01/09/2015   CLINICAL DATA:  Expressive a aphasia following placement of cardiac stent 01/08/2015  EXAM: MRI HEAD WITHOUT CONTRAST  TECHNIQUE: Multiplanar, multiecho pulse sequences of the brain and surrounding structures were obtained without intravenous contrast.  COMPARISON:  CT head without contrast 01/08/2015. Carotid ultrasound 01/09/2015.  FINDINGS: Bilateral brainstem infarcts are present. The largest areas in the inferior pons right left paramedian infarct extends over 2 cm anterior to posterior. Additional punctate foci of restricted diffusion are present within the occipital lobes bilaterally in the high left parietal lobe. There is a punctate area of focal infarct along the precentral sulcus on the right.  T2 changes are evident along the infarcts in the brainstem. Mild atrophy and periventricular white matter changes are chronic. No acute hemorrhage is evident.  Flow is present in the major intracranial arteries. There is a thick ram about a cystic lesion at floor of the left maxillary sinus. The collection measures 17 mm maximally and appears P associated with the alveolar ridge. The paranasal sinuses and mastoid air cells are otherwise clear. Skullbase is unremarkable. Midline structures are normal.  IMPRESSION: 1. Acute nonhemorrhagic bilateral infarcts of the brainstem. 2. Acute nonhemorrhagic bilateral occipital lobe infarcts. 3. Acute nonhemorrhagic punctate left parietal lobe infarct. 4. Acute nonhemorrhagic punctate posterior right frontal lobe infarct along the precentral gyrus. 5. Mild atrophy  and white matter disease likely reflects the sequela of chronic microvascular ischemia in addition to the acute infarcts. 6. 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 These results were called by telephone at the time of interpretation on 01/09/2015 at 12:08 pm to Dr. Arnoldo Hooker, who verbally acknowledged these results.   Electronically Signed   By: Marin Roberts M.D.   On: 01/09/2015 12:09    CBC  Recent Labs Lab 01/08/15 1222 01/09/15 2304 01/10/15 0744  WBC 13.5* 8.5 8.0  HGB 13.4 11.5* 12.3*  HCT 40.7 33.0* 35.6*  PLT 262 209 211  MCV 96 93.0 93.7  MCH 31.6 32.4 32.4  MCHC 32.9 34.8 34.6  RDW 13.2 13.2 13.2  LYMPHSABS 1.0 1.6  --   MONOABS 0.7 0.8  --   EOSABS 0.0 0.1  --   BASOSABS 0.1 0.0  --     Chemistries   Recent Labs Lab 01/08/15 1222 01/09/15 0602 01/09/15 2304 01/10/15 0744  NA 137 142 142 140  K 4.1 4.0 3.8 3.9  CL 108 112* 110 110  CO2 GLUCOSE 140* 107* 101* 94  BUN 22* 22* 19 18  CREATININE 1.03 0.96 0.96 0.92  CALCIUM 8.3* 8.5* 8.5 8.7  AST  --   --  21  --   ALT  --   --  20  --   ALKPHOS  --   --  45  --   BILITOT  --   --  0.8  --    ------------------------------------------------------------------------------------------------------------------ estimated creatinine clearance is 63 mL/min (by C-G formula based on Cr of 0.92). ------------------------------------------------------------------------------------------------------------------  No results for input(s): HGBA1C in the last 72 hours. ------------------------------------------------------------------------------------------------------------------ No results for input(s): CHOL, HDL, LDLCALC, TRIG, CHOLHDL, LDLDIRECT in the last 72 hours. ------------------------------------------------------------------------------------------------------------------ No results  for input(s): TSH, T4TOTAL, T3FREE, THYROIDAB in the last 72 hours.  Invalid input(s): FREET3 ------------------------------------------------------------------------------------------------------------------ No results for input(s): VITAMINB12, FOLATE, FERRITIN, TIBC, IRON, RETICCTPCT in the last 72 hours.  Coagulation profile  Recent Labs Lab 01/09/15 2304  INR 1.18    No results for input(s): DDIMER in the last 72 hours.  Cardiac Enzymes No results for input(s): CKMB, TROPONINI, MYOGLOBIN in the last 168 hours.  Invalid input(s): CK ------------------------------------------------------------------------------------------------------------------ Invalid input(s): POCBNP    Dyer Klug D.O. on 01/13/2015 at 12:19 PM  Between 7am to 7pm - Pager - [phone]  After 7pm go to [email or web address] - password [email or web address]  And look for the night coverage person covering for me after hours  [facility] Office  [phone]

## 2015-01-14 ENCOUNTER — Encounter (HOSPITAL_COMMUNITY): Payer: Self-pay | Admitting: Neurology

## 2015-01-14 ENCOUNTER — Inpatient Hospital Stay (HOSPITAL_COMMUNITY)
Admission: RE | Admit: 2015-01-14 | Discharge: 2015-01-15 | DRG: 057 | Disposition: A | Discharge status: Other Acute Inpatient Hospital | Payer: Medicare Other | Source: Intra-hospital | Attending: Internal Medicine | Admitting: Internal Medicine

## 2015-01-14 DIAGNOSIS — I639 Cerebral infarction, unspecified: Secondary | ICD-10-CM | POA: Diagnosis present

## 2015-01-14 DIAGNOSIS — Z955 Presence of coronary angioplasty implant and graft: Secondary | ICD-10-CM

## 2015-01-14 DIAGNOSIS — I10 Essential (primary) hypertension: Secondary | ICD-10-CM | POA: Diagnosis present

## 2015-01-14 DIAGNOSIS — I82491 Acute embolism and thrombosis of other specified deep vein of right lower extremity: Secondary | ICD-10-CM | POA: Diagnosis present

## 2015-01-14 DIAGNOSIS — R1312 Dysphagia, oropharyngeal phase: Secondary | ICD-10-CM | POA: Diagnosis present

## 2015-01-14 DIAGNOSIS — R579 Shock, unspecified: Secondary | ICD-10-CM | POA: Diagnosis not present

## 2015-01-14 DIAGNOSIS — K760 Fatty (change of) liver, not elsewhere classified: Secondary | ICD-10-CM | POA: Diagnosis present

## 2015-01-14 DIAGNOSIS — I69391 Dysphagia following cerebral infarction: Secondary | ICD-10-CM | POA: Diagnosis not present

## 2015-01-14 DIAGNOSIS — E785 Hyperlipidemia, unspecified: Secondary | ICD-10-CM | POA: Diagnosis present

## 2015-01-14 DIAGNOSIS — Z87891 Personal history of nicotine dependence: Secondary | ICD-10-CM

## 2015-01-14 DIAGNOSIS — I69351 Hemiplegia and hemiparesis following cerebral infarction affecting right dominant side: Secondary | ICD-10-CM | POA: Diagnosis not present

## 2015-01-14 DIAGNOSIS — I69392 Facial weakness following cerebral infarction: Secondary | ICD-10-CM

## 2015-01-14 DIAGNOSIS — I97610 Postprocedural hemorrhage and hematoma of a circulatory system organ or structure following a cardiac catheterization: Secondary | ICD-10-CM | POA: Diagnosis present

## 2015-01-14 DIAGNOSIS — I959 Hypotension, unspecified: Secondary | ICD-10-CM | POA: Diagnosis not present

## 2015-01-14 DIAGNOSIS — I82409 Acute embolism and thrombosis of unspecified deep veins of unspecified lower extremity: Secondary | ICD-10-CM | POA: Diagnosis present

## 2015-01-14 DIAGNOSIS — I739 Peripheral vascular disease, unspecified: Secondary | ICD-10-CM | POA: Diagnosis present

## 2015-01-14 DIAGNOSIS — G4733 Obstructive sleep apnea (adult) (pediatric): Secondary | ICD-10-CM | POA: Diagnosis present

## 2015-01-14 DIAGNOSIS — I2510 Atherosclerotic heart disease of native coronary artery without angina pectoris: Secondary | ICD-10-CM | POA: Diagnosis present

## 2015-01-14 DIAGNOSIS — I69322 Dysarthria following cerebral infarction: Principal | ICD-10-CM

## 2015-01-14 HISTORY — DX: Peripheral vascular disease, unspecified: I73.9

## 2015-01-14 LAB — CBC
HCT: 33.1 % — ABNORMAL LOW (ref 39.0–52.0)
HEMOGLOBIN: 11.4 g/dL — AB (ref 13.0–17.0)
MCH: 32.2 pg (ref 26.0–34.0)
MCHC: 34.4 g/dL (ref 30.0–36.0)
MCV: 93.5 fL (ref 78.0–100.0)
Platelets: 231 10*3/uL (ref 150–400)
RBC: 3.54 MIL/uL — ABNORMAL LOW (ref 4.22–5.81)
RDW: 12.9 % (ref 11.5–15.5)
WBC: 6.7 10*3/uL (ref 4.0–10.5)

## 2015-01-14 LAB — CREATININE, SERUM
CREATININE: 0.85 mg/dL (ref 0.61–1.24)
GFR calc Af Amer: >60 mL/min (ref >60)
GFR calc non Af Amer: >60 mL/min (ref >60)

## 2015-01-14 MED ORDER — FAMOTIDINE 20 MG PO TABS
20.0000 mg | ORAL_TABLET | Freq: Two times a day (BID) | ORAL | Status: DC
  Administered 2015-01-14: 20 mg via ORAL
  Filled 2015-01-14 (×4): qty 1

## 2015-01-14 MED ORDER — SENNA 8.6 MG PO TABS
1.0000 | ORAL_TABLET | Freq: Two times a day (BID) | ORAL | Status: DC
  Administered 2015-01-14: 8.6 mg via ORAL
  Filled 2015-01-14 (×4): qty 1

## 2015-01-14 MED ORDER — ONDANSETRON HCL 4 MG PO TABS: 4.0000 mg | ORAL_TABLET | Freq: Four times a day (QID) | ORAL | Status: DC | PRN

## 2015-01-14 MED ORDER — ATORVASTATIN CALCIUM 10 MG PO TABS
10.0000 mg | ORAL_TABLET | Freq: Every day | ORAL | Status: DC
  Filled 2015-01-14: qty 1

## 2015-01-14 MED ORDER — FAMOTIDINE 20 MG PO TABS
20.0000 mg | ORAL_TABLET | Freq: Two times a day (BID) | ORAL | Status: DC
  Administered 2015-01-14: 20 mg via ORAL
  Filled 2015-01-14: qty 1

## 2015-01-14 MED ORDER — DOCUSATE SODIUM 100 MG PO CAPS
100.0000 mg | ORAL_CAPSULE | Freq: Two times a day (BID) | ORAL | Status: AC
Start: 2015-01-14 — End: ?

## 2015-01-14 MED ORDER — ACETAMINOPHEN 325 MG PO TABS: 325.0000 mg | ORAL_TABLET | ORAL | Status: DC | PRN

## 2015-01-14 MED ORDER — SALINE SPRAY 0.65 % NA SOLN
1.0000 | NASAL | Status: DC | PRN
  Filled 2015-01-14: qty 44

## 2015-01-14 MED ORDER — DOCUSATE SODIUM 100 MG PO CAPS
100.0000 mg | ORAL_CAPSULE | Freq: Two times a day (BID) | ORAL | Status: DC
  Administered 2015-01-14: 100 mg via ORAL
  Filled 2015-01-14 (×4): qty 1

## 2015-01-14 MED ORDER — HEPARIN SODIUM (PORCINE) 5000 UNIT/ML IJ SOLN
5000.0000 [IU] | Freq: Three times a day (TID) | INTRAMUSCULAR | Status: DC
  Administered 2015-01-14 – 2015-01-15 (×3): 5000 [IU] via SUBCUTANEOUS
  Filled 2015-01-14 (×5): qty 1

## 2015-01-14 MED ORDER — ATORVASTATIN CALCIUM 10 MG PO TABS: 10.0000 mg | ORAL_TABLET | Freq: Every day | ORAL | Status: AC

## 2015-01-14 MED ORDER — ASPIRIN 325 MG PO TABS
325.0000 mg | ORAL_TABLET | Freq: Every day | ORAL | Status: DC
  Filled 2015-01-14 (×2): qty 1

## 2015-01-14 MED ORDER — RESOURCE THICKENUP CLEAR PO POWD
ORAL | Status: DC | PRN
  Filled 2015-01-14: qty 125

## 2015-01-14 MED ORDER — SALINE SPRAY 0.65 % NA SOLN: 1.0000 | NASAL | Status: AC | PRN

## 2015-01-14 MED ORDER — CLOPIDOGREL BISULFATE 75 MG PO TABS
75.0000 mg | ORAL_TABLET | Freq: Every day | ORAL | Status: DC
  Filled 2015-01-14 (×2): qty 1

## 2015-01-14 MED ORDER — SENNA 8.6 MG PO TABS: 1.0000 | ORAL_TABLET | Freq: Two times a day (BID) | ORAL | Status: AC

## 2015-01-14 MED ORDER — ASPIRIN 300 MG RE SUPP
300.0000 mg | Freq: Every day | RECTAL | Status: DC
  Filled 2015-01-14 (×2): qty 1

## 2015-01-14 MED ORDER — DIPHENHYDRAMINE-ZINC ACETATE 2-0.1 % EX CREA: TOPICAL_CREAM | Freq: Three times a day (TID) | CUTANEOUS | Status: AC | PRN

## 2015-01-14 MED ORDER — SORBITOL 70 % SOLN: 30.0000 mL | Freq: Every day | Status: DC | PRN

## 2015-01-14 MED ORDER — ONDANSETRON HCL 4 MG/2ML IJ SOLN: 4.0000 mg | Freq: Four times a day (QID) | INTRAMUSCULAR | Status: DC | PRN

## 2015-01-14 MED ORDER — FAMOTIDINE 20 MG PO TABS: 20.0000 mg | ORAL_TABLET | Freq: Two times a day (BID) | ORAL | Status: AC

## 2015-01-14 MED ORDER — HEPARIN SODIUM (PORCINE) 5000 UNIT/ML IJ SOLN: 5000.0000 [IU] | Freq: Three times a day (TID) | INTRAMUSCULAR | Status: DC

## 2015-01-14 MED ORDER — RESOURCE THICKENUP CLEAR PO POWD: ORAL | Status: AC

## 2015-01-14 NOTE — Interval H&P Note (Signed)
Jerome Bowen was admitted today to Inpatient Rehabilitation with the diagnosis of CVA.  The patient's history has been reviewed, patient examined, and there is no change in status.  Patient continues to be appropriate for intensive inpatient rehabilitation.  I have reviewed the patient's chart and labs.  Questions were answered to the patient's satisfaction.  [name] T 01/14/2015, 7:33 PM

## 2015-01-14 NOTE — Progress Notes (Signed)
Insurance has approved admission to inpt rehab today. I have contacted Dr. Catha GosselinMikhail and will make the arrangements for today. I will contact RN CM and SW. [phone]

## 2015-01-14 NOTE — Consult Note (Addendum)
WOC wound consult note Reason for Consult: Consult requested for rash on patient's back.  Wife states this started after he sat in the recliner for an extended period of time yesterday.  She assessed the back area during the consult and states it is greatly improved.  Pt's shirt is currently moist with sweat even though he is in bed. Wound type: Red patchy areas of red lesions scattered across upper back, no open wounds or drainage. Appearance is consistent with a heat rash. Dressing procedure/placement/frequency: Benadryl cream has been ordered PRN, but patient declines the offer at this time. Leave open to air and keep as dry as possible. Pt and wife deny further questions at this time. Please re-consult if further assistance is needed.  Thank-you,  Cammie Mcgeeawn Eryk Beavers MSN, RN, CWOCN, [place]AP, CNS [phone]

## 2015-01-14 NOTE — Progress Notes (Signed)
Speech Language Pathology Treatment: Dysphagia;Cognitive-Linquistic  Patient Details Name: Jerome Bowen Plessinger MRN: [id number] DOB: 03/12/1934 Today's Date: 01/14/2015 Time: [id number] SLP Time Calculation (min) (ACUTE ONLY): 22 min  Assessment / Plan / Recommendation Clinical Impression  Pt has immediate cough and/or wet vocal quality s/p ice chip trials, with ultimate cough and expectoration of what appears to be thin liquids and secretions. Trials of nectar thick liquids appear to be tolerated well with second swallow noted, which facilitated clearance of pharyngeal residuals on MBS yesterday. Pt showed therapist exercises he has been completing and commented on labial weakness - SLP provided two new exercises to facilitate oral strength/ROM in between therapy sessions. SLP provided Min-Mod cues throughout conversational speech for improved intelligibility. Pt remains highly motivated and will benefit from CIR to maximize functional gains.   HPI Other Pertinent Information: Jerome Bowen Winnie is a 81 y.o. male with past medical history of hypertension, dyslipidemia, coronary artery disease, obstructive sleep apnea on C Pap, seronegative arthritis. [facility]   Pertinent Vitals Pain Assessment: No/denies pain  SLP Plan  Continue with current plan of care    Recommendations Diet recommendations: Dysphagia 1 (puree);Nectar-thick liquid Liquids provided via: Cup;No straw Medication Administration: Crushed with puree Supervision: Patient able to self feed;Full supervision/cueing for compensatory strategies Compensations: Slow rate;Small sips/bites;Check for anterior loss;Check for pocketing Postural Changes and/or Swallow Maneuvers: Seated upright 90 degrees;Upright 30-60 min after meal       Oral Care Recommendations: Oral care BID Follow up Recommendations: Inpatient Rehab Plan: Continue with current plan of care    Maxcine HamLaura Paiewonsky, M.A.  CCC-SLP [phone]  [name], Kellsey Sansone 01/14/2015, 5:06 PM

## 2015-01-14 NOTE — Progress Notes (Signed)
Placed patient on home CPAP. 

## 2015-01-14 NOTE — Discharge Summary (Signed)
Physician Discharge Summary  Jerome DanasJack L Bowen ZOX:[id number] DOB: [birth year] DOA: 01/09/2015  PCP: No PCP Per Patient  Admit date: 01/09/2015 Discharge date: 01/14/2015  Time spent: 45 minutes  Recommendations for Outpatient Follow-up:  Patient will be discharged to Inpatient Rehab.  Patient continue physical, speech, occupational therapy as recommended by rehabilitation.  Patient will also need follow-up with neurology in 2 months of discharge.  Patient will need to follow up with primary care provider within one week of discharge.  Patient should continue medications as prescribed.  Patient should follow a dysphagia 1 diet, nectar thick liquids.  Discharge Diagnoses:  Acute CVA Dysphagia CAD with recent RCA drug-eluting stent Essential hypertension Dyslipidemia Rash  Discharge Condition: Stable  Diet recommendation: Dysphagia 1, nectar thick liquids  Filed Weights   01/11/15 1500 01/12/15 1736 01/13/15 0500  Weight: 79.3 kg (174 lb 13.2 oz) 80.9 kg (178 lb 5.6 oz) 81.8 kg (180 lb 5.4 oz)    History of present illness:  on 01/09/2015 by Dr. Lynden OxfordPranav Patel Jerome Bowen is a 81 y.o. male with Past medical history of hypertension, dyslipidemia, coronary artery disease, obstructive sleep apnea on C Pap, seronegative arthritis. The patient was brought in from [facility]. The patient was recently hospitalized for elective angiography after an abnormal stress test. The patient was having progressively worsening chest pain prior to the stress test. The angiography showed that the patient had an RCA obstruction and patient underwent PCI with drug-eluting stent. During the procedure the patient become agitated and was acting confused. Initially it was thought that the patient had a reaction to Versed. Patient also developed a small hematoma at the groin. Patient was placed on tirofiban overnight per protocol. A CT of the head initially did not reveal any acute abnormality. Due to persistent  aphasia and confusion and MRI was up pain which was positive for multifocal strokes. Carotid Doppler as well as echo program was obtained and the patient was sent here for further workup. The patient is coming from hospital. And at his baseline independent for most of his ADL.  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 Reconstitution of left vertebral artery at the skull base supplying left PICA and giving minimal contribution to basilar-no other stenosis noted -2-D echo: EF 65-70% -Carotid Doppler showed very minimal amount of bilateral intimal wall thickening and atherosclerotic plaque right relatively greater than left but no hemodynamically significant stenosis -PT, OT recommending CIR-consult has been placed -Currently on aspirin and Plavix  -A1c 5.4, LDL 93 -Follow up with neurology in 2 months  Dysphagia -Secondary to CVA -Currently NPO, failed swallow testing, has PANDA tube- have requested dietitian consult to start tube feeds via Panda tube -Neurology recommending to wait another 2-3 days to see if there is improvement in swallowing-if not, a PEG tube will need to be placed prior to discharge from the hospital -Continue tube feeds and will obtain swallow study -Speech therapy following- patient's currently on dysphagia 1 diet and panda tube removed on 01/13/2015 -Patient currently tolerating diet well.  CAD with recent RCA drug-eluting stent -Continue with aspirin and Plavix  Essential hypertension -Stable -Losartan initially held due to CVA  Dyslipidemia -LDL 93  placed on  Lipitor  Rash -Patient developed rash on back- does not follow a dermatomal pattern, therefore unlikely shingles -Rash is not painful -Possibly related to heat -Wound care consulted -Continue benadryl as needed  Procedures  Echocardiogram Carotid doppler  Consults  Neurology Inpatient rehab  Discharge Exam: Filed Vitals:   01/14/15 1354  BP: 148/69  Pulse: 82  Temp: 98.3 F (36.8 C)  Resp: 18   Exam  General: Well developed, well nourished, no distress  HEENT: NCAT, mucous membranes moist.   Cardiovascular: normal S1/S2, RRR, no murmurs  Respiratory: Clear to auscultation  Abdomen: Soft, nontender, nondistended, + bowel sounds  Extremities: warm dry without cyanosis clubbing or edema  Skin: Erythema improving, no blisters noted this time  Neuro: AAOx3, speech and strength improving  Psych: appropriate mood and affect  Discharge Instructions      Discharge Instructions    Ambulatory referral to Neurology    Complete by:  As directed   Please schedule post stroke follow up in 2 months.     Discharge instructions    Complete by:  As directed   Patient will be discharged to Inpatient Rehab.  Patient continue physical, speech, occupational therapy as recommended by rehabilitation.  Patient will also need follow-up with neurology in 2 months of discharge.  Patient will need to follow up with primary care provider within one week of discharge.  Patient should continue medications as prescribed.  Patient should follow a dysphagia 1 diet, nectar thick liquids.            Medication List    TAKE these medications        aspirin EC 325 MG tablet  Take 325 mg by mouth daily.     atorvastatin 10 MG tablet  Commonly known as:  LIPITOR  Place 1 tablet (10 mg total) into feeding tube daily at 6 PM.     cholecalciferol 1000 UNITS tablet  Commonly known as:  VITAMIN D  Take 1,000 Units by mouth daily.     clopidogrel 75 MG tablet  Commonly known as:   PLAVIX  Take 75 mg by mouth daily.     Cyanocobalamin 1000 MCG Tbcr  Take 1 tablet by mouth daily.     diphenhydrAMINE-zinc acetate cream  Commonly known as:  BENADRYL  Apply topically 3 (three) times daily as needed for itching.     docusate sodium 100 MG capsule  Commonly known as:  COLACE  Take 1 capsule (100 mg total) by mouth 2 (two) times daily.     famotidine 20 MG tablet  Commonly known as:  PEPCID  Take 1 tablet (20 mg total) by mouth 2 (two) times daily.     losartan 100 MG tablet  Commonly known as:  COZAAR  Take 100 mg by mouth daily.     RESOURCE THICKENUP CLEAR Powd  Use as needed.     senna 8.6 MG Tabs tablet  Commonly known as:  SENOKOT  Take 1 tablet (8.6 mg total) by mouth 2 (two) times daily.     sodium chloride 0.65 % Soln nasal spray  Commonly known as:  OCEAN  Place 1 spray into both nostrils as needed for congestion.     tamsulosin 0.4 MG Caps capsule  Commonly known as:  FLOMAX  Take 0.4 mg by mouth daily.     vitamin E 1000 UNIT capsule  Take 1,000 Units by mouth daily.       No Known Allergies Follow-up Information  Follow up with Xu,Jindong, MD In 2 months.   Specialty:  Neurology   Why:  Hospital follow up   Contact information:   [address] [place] [address] [phone]        The results of significant diagnostics from this hospitalization (including imaging, microbiology, ancillary and laboratory) are listed below for reference.    Significant Diagnostic Studies: Ct Abdomen Pelvis Wo Contrast  01/08/2015   CLINICAL DATA:  Status post cardiac catheterization. Swelling in the right lower quadrant. Possible hematoma.  EXAM: CT ABDOMEN AND PELVIS WITHOUT CONTRAST  TECHNIQUE: Multidetector CT imaging of the abdomen and pelvis was performed following the standard protocol without IV contrast.  COMPARISON:  None.  FINDINGS: The lung bases appear emphysematous but are clear. No pleural or pericardial effusion  is identified.  Contrast material from the patient's cardiac catheterization is seen in the kidneys and urinary bladder. Urinary bladder contrast creates streak artifact in the pelvis. Hemorrhage is seen in the right groin which is not confluent and measures approximately 5.7 cm AP x 8.7 cm transverse by at least 10 cm craniocaudal. Hematoma is also identified in the right pelvis measuring approximately 5.1 cm AP by 3.6 cm transverse by 7.2 cm craniocaudal.  The gallbladder, spleen, adrenal glands, pancreas and kidneys are unremarkable. A tiny low attenuating lesion in the left hepatic lobe anteriorly and near the dome is likely a cyst. The liver is otherwise unremarkable. No lymphadenopathy is seen. Scattered aortoiliac atherosclerosis without aneurysm is noted. The stomach small bowel and appendix appear normal.  There is a superior endplate compression fracture of L2 with vertebral body height loss of approximately 40%. The fracture appears remote but cannot be definitively characterized. No lytic or sclerotic bony lesion is seen.  IMPRESSION: The study is positive for a moderate sized hematoma in the right groin extending into the right pelvis as described above. No other acute abnormality is identified.  Aortoiliac atherosclerosis without aneurysm.  L2 superior endplate compression fracture appears remote but cannot be definitively characterized.   Electronically Signed   By: Drusilla Kanner M.D.   On: 01/08/2015 10:38   Mr Angiogram Neck W Wo Contrast  01/10/2015   CLINICAL DATA:  Acute brainstem infarction.  EXAM: MRA NECK WITHOUT AND WITH CONTRAST  MRA HEAD WITHOUT CONTRAST  TECHNIQUE: Multiplanar and multiecho pulse sequences of the neck were obtained without and with intravenous contrast. Angiographic images of the neck were obtained using MRA technique without and with intravenous contast.; Angiographic images of the Circle of Willis were obtained using MRA technique without intravenous contrast.   CONTRAST:  20mL MULTIHANCE GADOBENATE DIMEGLUMINE 529 MG/ML IV SOLN  COMPARISON:  MRI 01/09/2015.  Ultrasound 01/09/2015.  FINDINGS: MRA NECK FINDINGS  Branching pattern of the brachiocephalic vessels from the arch is normal. No origin stenoses.  Both common carotid arteries are widely patent to their respective bifurcation. Both carotid bifurcations are normal without narrowing or irregularity. Both cervical internal carotid arteries are tortuous but widely patent an otherwise normal.  The right vertebral artery is patent at its origin. I think there is narrowing on the order of 30%. Beyond that, the vessel is widely patent through the cervical region.  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 There does not appear to be any reconstitution until the skullbase level where there is probably minor reconstitution of the distal left vertebral artery, supplying at least left PICA. There may be minimal reconstituted flow contributed to the basilar.  MRA HEAD FINDINGS  Both internal carotid arteries are widely patent through the skullbase. No siphon stenosis. The anterior and middle cerebral vessels are patent without proximal stenosis, aneurysm or vascular malformation. The right PCA takes a fetal origin from the anterior circulation. There is a large posterior communicating artery on the left, but with some communication with the distal basilar.  The right vertebral artery is a large vessel that supplies the basilar. No basilar stenosis. No antegrade flow is seen in the left vertebral artery, though this study is not as sensitive as information gleaned from the contrast-enhanced study, which showed some reconstitution of the distal left vertebral artery by cervical collaterals at the skullbase, supplying the left PICA and with minimal contribution to the basilar. Both superior cerebellar arteries show flow. As noted previously, the right PCA receives it supply from the anterior circulation. The left PCA receives  it supply from both the basilar tip and the anterior circulation.  IMPRESSION: No anterior circulation disease identified.  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 Reconstitution of the distal left vertebral artery at the skullbase level, supplying left PICA and giving minimal contribution to the basilar.  No basilar stenosis. Superior cerebellar and posterior cerebral arteries show flow presently.   Electronically Signed   By: Paulina Fusi M.D.   On: 01/10/2015 11:48   US Carotid Bilateral  01/09/2015   CLINICAL DATA:  History of stroke, hypertension, CAD, visual disturbance, hyperlipidemia.  EXAM: BILATERAL CAROTID DUPLEX ULTRASOUND  TECHNIQUE: Wallace Cullens scale imaging, color Doppler and duplex ultrasound were performed of bilateral carotid and vertebral arteries in the neck.  COMPARISON:  Head CT- 01/08/2015; 12/19/2014  FINDINGS: Criteria: Quantification of carotid stenosis is based on velocity parameters that correlate the residual internal carotid diameter with NASCET-based stenosis levels, using the diameter of the distal internal carotid lumen as the denominator for stenosis measurement.  The following velocity measurements were obtained:  RIGHT  ICA:  104/15 cm/sec  CCA:  133/18 cm/sec  SYSTOLIC ICA/CCA RATIO:  0.8  DIASTOLIC ICA/CCA RATIO:  0.9  ECA:  95 cm/sec  LEFT  ICA:  115/21 cm/sec  CCA:  134/17 cm/sec  SYSTOLIC ICA/CCA RATIO:  0.9  DIASTOLIC ICA/CCA RATIO:  1.3  ECA:  115 cm/sec  RIGHT CAROTID ARTERY: There is a minimal amount of eccentric mixed echogenic plaque within the right carotid bulb (image 12), extending to involve the origin and proximal aspects of the right internal carotid artery (image 18), not resulting in elevated peak systolic velocities within the interrogated course of the right internal carotid artery to suggest a hemodynamically significant stenosis.  RIGHT VERTEBRAL ARTERY:  Antegrade flow  LEFT CAROTID ARTERY: There is a very minimal amount of intimal wall thickening  involving the origin and proximal aspects of the left internal carotid artery (image 49), not resulting in elevated peak systolic velocities within the interrogated course of the left internal carotid artery to suggest a hemodynamically significant stenosis.  LEFT VERTEBRAL ARTERY:  Antegrade flow  IMPRESSION: Very minimal amount of bilateral intimal wall thickening and atherosclerotic plaque, right subjectively greater than left, not resulting in a hemodynamically significant stenosis.   Electronically Signed   By: Simonne Come M.D.   On: 01/09/2015 10:43   Dg Abd Portable 1v  01/10/2015   CLINICAL DATA:  Nasogastric tube placement.  Esophageal stricture.  EXAM: PORTABLE ABDOMEN - 1 VIEW  COMPARISON:  None.  FINDINGS: Feeding tube is identified within the proximal stomach. Mild motion degradation. Nonobstructive bowel gas pattern. Mid and lower pelvis excluded.  IMPRESSION: Feeding tube terminating at the proximal stomach.   Electronically Signed   By: Jeronimo Greaves M.D.   On: 01/10/2015 00:19   Dg Swallowing Func-speech Pathology  01/13/2015    Objective Swallowing Evaluation:    Patient Details  Name: Jerome Bowen MRN: [id number] Date of Birth: Apr 27, 1934  Today's Date: 01/13/2015 Time: SLP Start Time (ACUTE ONLY): 1142-SLP Stop Time (ACUTE ONLY): 1206 SLP Time Calculation (min) (ACUTE ONLY): 24 min  Past Medical History:  Past Medical History  Diagnosis Date  . Essential hypertension   . Dyslipidemia   . CAD (coronary artery disease)   . S/P right coronary artery (RCA) stent placement   . OSA (obstructive sleep apnea)   . Esophageal stricture   . Seronegative arthritis   . Fatty liver    Past Surgical History:  Past Surgical History  Procedure Laterality Date  . Cardiac catheterization    . Colonoscopy     HPI: KODI STEIL is a 81 y.o. male with past medical history of  hypertension, dyslipidemia, coronary artery disease, obstructive sleep  apnea on C Pap, seronegative arthritis. The patient was brought in  from  [facility].   Assessment / Plan / Recommendation CHL IP CLINICAL IMPRESSIONS 01/13/2015  Therapy Diagnosis Moderate oral phase dysphagia;Moderate pharyngeal phase  dysphagia   Clinical Impression Pt shows increased strength and ROM with oral  structures, which increases his ability to orally control and manipulate  POs. A moderate oropharyngeal dyspphagia persists with impaired  mastication and slow oral transit leading to premature spillage with all  consistencies. Thin liquids are silently penetrated before the swallow,  requiring cued cough to clear. Postural maneuvers and bolus manipulation  did not prevent penetration, but intervention for nectar thick liquids  adequately increased airway protection. Mild-moderate residuals remaind in  the vallecular space post-swallow, but were sufficiently reduced with  reflexive second swallows. Recommend to initiate Dys 1 diet and nectar  thick liquids. With adequate tolerance of this diet, pt may be appropriate  for initiation of water protocol and/or ice chips with SLP.      CHL IP TREATMENT RECOMMENDATION 01/13/2015  Treatment Recommendations Therapy as outlined in treatment plan below     CHL IP DIET RECOMMENDATION 01/13/2015  SLP Diet Recommendations Dysphagia 1 (Puree);Nectar  Liquid Administration via (None)  Medication Administration Crushed with puree  Compensations Slow rate;Small sips/bites;Check for anterior loss;Check for  pocketing  Postural Changes and/or Swallow Maneuvers (None)     CHL IP OTHER RECOMMENDATIONS 01/13/2015  Recommended Consults (None)  Oral Care Recommendations Oral care BID  Other Recommendations (None)     CHL IP FOLLOW UP RECOMMENDATIONS 01/13/2015  Follow up Recommendations Inpatient Rehab     CHL IP FREQUENCY AND DURATION 01/13/2015  Speech Therapy Frequency (ACUTE ONLY) (None)  Treatment Duration 2 weeks     Pertinent Vitals/Pain: n/a     SLP Swallow Goals No flowsheet data found.  No flowsheet data found.    CHL IP REASON FOR REFERRAL  01/13/2015  Reason for Referral Objectively evaluate swallowing function     CHL IP ORAL PHASE 01/13/2015  Oral Phase Impaired       CHL IP PHARYNGEAL PHASE 01/13/2015  Pharyngeal Phase Impaired        CHL IP CERVICAL ESOPHAGEAL PHASE 01/13/2015  Cervical Esophageal Phase [facility]         Maxcine Ham, M.A. CCC-SLP [phone]  Maxcine Ham 01/13/2015, 1:33 PM    Dg Swallowing Func-speech Pathology  01/10/2015  Objective Swallowing Evaluation:    Patient Details  Name: Jerome Bowen MRN: [id number] Date of Birth: 14-Jan-1934  Today's Date: 01/10/2015 Time: SLP Start Time (ACUTE ONLY): 1430-SLP Stop Time (ACUTE ONLY): 1500 SLP Time Calculation (min) (ACUTE ONLY): 30 min  Past Medical History:  Past Medical History  Diagnosis Date  . Essential hypertension   . Dyslipidemia   . CAD (coronary artery disease)   . S/P right coronary artery (RCA) stent placement   . OSA (obstructive sleep apnea)   . Esophageal stricture   . Seronegative arthritis   . Fatty liver    Past Surgical History: No past surgical history on file. HPI:  Other Pertinent Information: Jerome Bowen is a 81 y.o. male with past  medical history of hypertension, dyslipidemia, coronary artery disease,  obstructive sleep apnea on C Pap, seronegative arthritis. The patient was  brought in from [facility].  No Data Recorded  Assessment / Plan / Recommendation CHL IP CLINICAL IMPRESSIONS 01/10/2015  Therapy Diagnosis Severe oral phase dysphagia;Moderate pharyngeal phase  dysphagia  Clinical Impression MBSS completed.  Pt presents with severe oral and  moderate pharyngeal dysphagia c/b severe oral discoordination and poor a/p  transit of the bolus.  The pt was unable to adequately manipulate thins or  purees.  The entire thin bolus was anteriorly lost and the pt was unable  to move the puree bolus into the pharynx requiring oral suctioning.   Pharyngeally the pt presented with a delayed swallow trigger, decreased  constriction with mild vallecular and pyriform  residue and decreased  epiglottic deflection with tsp boluses of honey.  Given the pt's current  swallowing ability rx keep the pt NPO.  Rx therapeutic feeds of honey  liquids via tsp sips with the SLP only.  The pt will require intense ST  f/u at the next level of care.        CHL IP TREATMENT RECOMMENDATION 01/10/2015  Treatment Recommendations Therapy as outlined in treatment plan below     CHL IP DIET RECOMMENDATION 01/10/2015  SLP Diet Recommendations NPO  Liquid Administration via (None)  Medication Administration Via alternative means  Compensations (None)  Postural Changes and/or Swallow Maneuvers (None)     CHL IP OTHER RECOMMENDATIONS 01/10/2015  Recommended Consults (None)  Oral Care Recommendations Oral care BID  Other Recommendations (None)     CHL IP FOLLOW UP RECOMMENDATIONS 01/10/2015  Follow up Recommendations Inpatient Rehab     CHL IP FREQUENCY AND DURATION 01/10/2015  Speech Therapy Frequency (ACUTE ONLY) min 2x/week  Treatment Duration 2 weeks         CHL IP REASON FOR REFERRAL 01/10/2015  Reason for Referral Objectively evaluate swallowing function     CHL IP ORAL PHASE 01/10/2015  Lips (None)  Tongue (None)  Mucous membranes (None)  Nutritional status (None)  Other (None)  Oxygen therapy (None)  Oral Phase Impaired  Oral - Pudding Teaspoon (None)  Oral - Pudding Cup (None)  Oral - Honey Teaspoon (None)  Oral - Honey Cup (None)  Oral - Honey Syringe (None)  Oral - Nectar Teaspoon (None)  Oral - Nectar Cup (None)  Oral - Nectar Straw (None)  Oral - Nectar Syringe (None)  Oral - Ice Chips (None)  Oral - Thin Teaspoon (None)  Oral - Thin Cup (None)  Oral - Thin Straw (None)  Oral - Thin Syringe (None)  Oral - Puree (None)  Oral - Mechanical Soft (None)  Oral - Regular (None)  Oral - Multi-consistency (  None)  Oral - Pill (None)  Oral Phase - Comment (None)      CHL IP PHARYNGEAL PHASE 01/10/2015  Pharyngeal Phase Impaired  Pharyngeal - Pudding Teaspoon (None)  Penetration/Aspiration details (pudding  teaspoon) (None)  Pharyngeal - Pudding Cup (None)  Penetration/Aspiration details (pudding cup) (None)  Pharyngeal - Honey Teaspoon (None)  Penetration/Aspiration details (honey teaspoon) (None)  Pharyngeal - Honey Cup (None)  Penetration/Aspiration details (honey cup) (None)  Pharyngeal - Honey Syringe (None)  Penetration/Aspiration details (honey syringe) (None)  Pharyngeal - Nectar Teaspoon (None)  Penetration/Aspiration details (nectar teaspoon) (None)  Pharyngeal - Nectar Cup (None)  Penetration/Aspiration details (nectar cup) (None)  Pharyngeal - Nectar Straw (None)  Penetration/Aspiration details (nectar straw) (None)  Pharyngeal - Nectar Syringe (None)  Penetration/Aspiration details (nectar syringe) (None)  Pharyngeal - Ice Chips (None)  Penetration/Aspiration details (ice chips) (None)  Pharyngeal - Thin Teaspoon (None)  Penetration/Aspiration details (thin teaspoon) (None)  Pharyngeal - Thin Cup (None)  Penetration/Aspiration details (thin cup) (None)  Pharyngeal - Thin Straw (None)  Penetration/Aspiration details (thin straw) (None)  Pharyngeal - Thin Syringe (None)  Penetration/Aspiration details (thin syringe') (None)  Pharyngeal - Puree (None)  Penetration/Aspiration details (puree) (None)  Pharyngeal - Mechanical Soft (None)  Penetration/Aspiration details (mechanical soft) (None)  Pharyngeal - Regular (None)  Penetration/Aspiration details (regular) (None)  Pharyngeal - Multi-consistency (None)  Penetration/Aspiration details (multi-consistency) (None)  Pharyngeal - Pill (None)  Penetration/Aspiration details (pill) (None)  Pharyngeal Comment (None)             Fleet ContrasGoodman, Melissa N 01/10/2015, 3:26 PM  Dimas AguasMelissa Goodman, MA, CCC-SLP Acute Rehab SLP [phone]]     Mr Maxine GlennMra Head/brain Wo Cm  01/10/2015   CLINICAL DATA:  Acute brainstem infarction.  EXAM: MRA NECK WITHOUT AND WITH CONTRAST  MRA HEAD WITHOUT CONTRAST  TECHNIQUE: Multiplanar and multiecho pulse sequences of the neck were obtained without and  with intravenous contrast. Angiographic images of the neck were obtained using MRA technique without and with intravenous contast.; Angiographic images of the Circle of Willis were obtained using MRA technique without intravenous contrast.  CONTRAST:  20mL MULTIHANCE GADOBENATE DIMEGLUMINE 529 MG/ML IV SOLN  COMPARISON:  MRI 01/09/2015.  Ultrasound 01/09/2015.  FINDINGS: MRA NECK FINDINGS  Branching pattern of the brachiocephalic vessels from the arch is normal. No origin stenoses.  Both common carotid arteries are widely patent to their respective bifurcation. Both carotid bifurcations are normal without narrowing or irregularity. Both cervical internal carotid arteries are tortuous but widely patent an otherwise normal.  The right vertebral artery is patent at its origin. I think there is narrowing on the order of 30%. Beyond that, the vessel is widely patent through the cervical region.  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 There does not appear to be any reconstitution until the skullbase level where there is probably minor reconstitution of the distal left vertebral artery, supplying at least left PICA. There may be minimal reconstituted flow contributed to the basilar.  MRA HEAD FINDINGS  Both internal carotid arteries are widely patent through the skullbase. No siphon stenosis. The anterior and middle cerebral vessels are patent without proximal stenosis, aneurysm or vascular malformation. The right PCA takes a fetal origin from the anterior circulation. There is a large posterior communicating artery on the left, but with some communication with the distal basilar.  The right vertebral artery is a large vessel that supplies the basilar. No basilar stenosis. No antegrade flow is seen in the left vertebral artery, though this  study is not as sensitive as information gleaned from the contrast-enhanced study, which showed some reconstitution of the distal left vertebral artery by cervical collaterals  at the skullbase, supplying the left PICA and with minimal contribution to the basilar. Both superior cerebellar arteries show flow. As noted previously, the right PCA receives it supply from the anterior circulation. The left PCA receives it supply from both the basilar tip and the anterior circulation.  IMPRESSION: No anterior circulation disease identified.  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 Reconstitution of the distal left vertebral artery at the skullbase level, supplying left PICA and giving minimal contribution to the basilar.  No basilar stenosis. Superior cerebellar and posterior cerebral arteries show flow presently.   Electronically Signed   By: Paulina Fusi M.D.   On: 01/10/2015 11:48   Ct Head Limited W/o Cm  01/08/2015   CLINICAL DATA:  Cardiac catheterization today. Altered mental status.  EXAM: CT HEAD WITHOUT CONTRAST  TECHNIQUE: Contiguous axial images were obtained from the base of the skull through the vertex without intravenous contrast.  COMPARISON:  None.  FINDINGS: Vascular contrast is identified from preceding cardiac catheterization.  Mild atrophy, appropriate for age.  Negative for hydrocephalus.  Negative for acute or chronic ischemia. Negative for hemorrhage or mass  Normal enhancement.  Calvarium intact.  IMPRESSION: Age related atrophy.  No acute abnormality.  These results will be called to the ordering clinician or representative by the Radiologist Assistant, and communication documented in the PACS or zVision Dashboard.   Electronically Signed   By: Marlan Palau M.D.   On: 01/08/2015 10:53   Mr Brain Ltd W/o Cm  01/09/2015   CLINICAL DATA:  Expressive a aphasia following placement of cardiac stent 01/08/2015  EXAM: MRI HEAD WITHOUT CONTRAST  TECHNIQUE: Multiplanar, multiecho pulse sequences of the brain and surrounding structures were obtained without intravenous contrast.  COMPARISON:  CT head without contrast 01/08/2015. Carotid ultrasound 01/09/2015.   FINDINGS: Bilateral brainstem infarcts are present. The largest areas in the inferior pons right left paramedian infarct extends over 2 cm anterior to posterior. Additional punctate foci of restricted diffusion are present within the occipital lobes bilaterally in the high left parietal lobe. There is a punctate area of focal infarct along the precentral sulcus on the right.  T2 changes are evident along the infarcts in the brainstem. Mild atrophy and periventricular white matter changes are chronic. No acute hemorrhage is evident.  Flow is present in the major intracranial arteries. There is a thick ram about a cystic lesion at floor of the left maxillary sinus. The collection measures 17 mm maximally and appears P associated with the alveolar ridge. The paranasal sinuses and mastoid air cells are otherwise clear. Skullbase is unremarkable. Midline structures are normal.  IMPRESSION: 1. Acute nonhemorrhagic bilateral infarcts of the brainstem. 2. Acute nonhemorrhagic bilateral occipital lobe infarcts. 3. Acute nonhemorrhagic punctate left parietal lobe infarct. 4. Acute nonhemorrhagic punctate posterior right frontal lobe infarct along the precentral gyrus. 5. Mild atrophy and white matter disease likely reflects the sequela of chronic microvascular ischemia in addition to the acute infarcts. 6. 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 These results were called by telephone at the time of interpretation on 01/09/2015 at 12:08 pm to Dr. Arnoldo Hooker, who verbally acknowledged these results.   Electronically Signed   By: Marin Roberts M.D.   On: 01/09/2015 12:09  Microbiology: No results found for this or any previous visit (from the past 240 hour(s)).   Labs: Basic Metabolic Panel:  Recent Labs Lab 01/08/15 1222 01/09/15 0602 01/09/15 2304 01/10/15 0744  NA 137 142 142 140  K 4.1 4.0 3.8  3.9  CL 108 112* 110 110  CO2 24 24 23 22   GLUCOSE 140* 107* 101* 94  BUN 22* 22* 19 18  CREATININE 1.03 0.96 0.96 0.92  CALCIUM 8.3* 8.5* 8.5 8.7   Liver Function Tests:  Recent Labs Lab 01/09/15 2304  AST 21  ALT 20  ALKPHOS 45  BILITOT 0.8  PROT 6.0  ALBUMIN 3.0*   No results for input(s): LIPASE, AMYLASE in the last 168 hours. No results for input(s): AMMONIA in the last 168 hours. CBC:  Recent Labs Lab 01/08/15 1222 01/09/15 2304 01/10/15 0744  WBC 13.5* 8.5 8.0  NEUTROABS 11.7* 6.1  --   HGB 13.4 11.5* 12.3*  HCT 40.7 33.0* 35.6*  MCV 96 93.0 93.7  PLT 262 209 211   Cardiac Enzymes: No results for input(s): CKTOTAL, CKMB, CKMBINDEX, TROPONINI in the last 168 hours. BNP: BNP (last 3 results) No results for input(s): BNP in the last 8760 hours.  ProBNP (last 3 results) No results for input(s): PROBNP in the last 8760 hours.  CBG:  Recent Labs Lab 01/12/15 2103 01/13/15 0048 01/13/15 0501 01/13/15 0754 01/13/15 1128  GLUCAP 145* 112* 135* 139* 105*       Signed:  Brantlee Hinde  [facility] 01/14/2015, 5:04 PM

## 2015-01-14 NOTE — H&P (Signed)
Expand All Collapse All      Physical Medicine and Rehabilitation Admission H&P   Chief complaint: Weakness  HPI: Jerome Bowen is a 81 y.o. right handed male with history of hypertension, hyperlipidemia and angina. Patient underwent elective cardiac cath for chest pain with abnormal stress test findings of distal RCA stenosis underwent stenting 01/08/2015 at [facility]. Patient independent prior to admission living with his wife in [place]. Following the procedure noted to be agitated as well as difficulty speaking. MRI obtained which showed multifocal infarcts in both anterior and posterior circulation consistent with cardiac embolus transferred to [facility]. Patient also had developed postoperative hematoma after cardiac catheterization. Did not receive TPA. Echocardiogram with ejection fraction of 70% systolic function was vigorous. MRI of the brain without stenosis. Neurology consulted presently maintained on aspirin and Plavix therapy. Subcutaneous heparin for DVT prophylaxis. Patient is nothing by mouth with nasogastric tube feeds after a failed modified barium swallow with diet advanced to a dysphagia #1 nectar liquids 01/13/2015 and nasogastric tube removed. Physical therapy evaluation completed with recommendations of physical medicine rehabilitation consult. Patient was admitted for a comprehensive rehabilitation program  ROS Review of Systems  Eyes: Positive for blurred vision.  Respiratory: Positive for shortness of breath.  Cardiovascular: Positive for chest pain.  Genitourinary: Positive for urgency.  All other systems reviewed and are negative   Past Medical History  Diagnosis Date  . Essential hypertension   . Dyslipidemia   . CAD (coronary artery disease)   . S/P right coronary artery (RCA) stent placement   . OSA (obstructive sleep apnea)   . Esophageal stricture   . Seronegative arthritis     . Fatty liver    Past Surgical History  Procedure Laterality Date  . Cardiac catheterization    . Colonoscopy     Family History  Problem Relation Age of Onset  . Heart disease Brother    Social History:  reports that he has quit smoking. His smoking use included Cigarettes. He has quit using smokeless tobacco. He reports that he drinks alcohol. He reports that he does not use illicit drugs. Allergies: No Known Allergies Medications Prior to Admission  Medication Sig Dispense Refill  . aspirin EC 325 MG tablet Take 325 mg by mouth daily.    . cholecalciferol (VITAMIN D) 1000 UNITS tablet Take 1,000 Units by mouth daily.    . clopidogrel (PLAVIX) 75 MG tablet Take 75 mg by mouth daily.    . Cyanocobalamin 1000 MCG TBCR Take 1 tablet by mouth daily.    Marland Kitchen losartan (COZAAR) 100 MG tablet Take 100 mg by mouth daily.    . tamsulosin (FLOMAX) 0.4 MG CAPS capsule Take 0.4 mg by mouth daily.     . vitamin E 1000 UNIT capsule Take 1,000 Units by mouth daily.      Home: Home Living Family/patient expects to be discharged to:: Private residence Living Arrangements: Spouse/significant other Available Help at Discharge: Family Type of Home: House Home Access: Stairs to enter Secretary/administrator of Steps: 2 Entrance Stairs-Rails: Right Home Layout: Two level, Able to live on main level with bedroom/bathroom Home Equipment: None Lives With: Spouse  Functional History: Prior Function Level of Independence: Independent  Functional Status:  Mobility: Bed Mobility Overal bed mobility: Needs Assistance, +2 for physical assistance Bed Mobility: Supine to Sit, Sit to Supine, Rolling Rolling: Min assist Supine to sit: Mod assist Sit to supine: +2 for physical assistance, Min assist General bed  mobility comments: in chair on arrival. Wife and pt report extended time in chair for comfort with secretions Transfers Overall  transfer level: Needs assistance Equipment used: Rolling walker (2 wheeled) Transfers: Sit to/from Stand Sit to Stand: +2 physical assistance, Mod assist General transfer comment: performed x3, verbal cues for hand placement, cues for weight shift to achieve static standing. Pt with R posterior lean with immediate standing Ambulation/Gait Ambulation/Gait assistance: Mod assist, +2 physical assistance (1 person max assist with progression through gait training) Ambulation Distance (Feet): 50 Feet (X2, with increments of 10- 20 ft of tactile gait retraining) Assistive device: 2 person hand held assist Adult nurse(Rolling Walker with progression of gait) General Gait Details: +2 physical assist for gait training, decreased proprioception for RLE stride placement, improvements noted with facilitation of upright posture and tactile cues for weight shift to the left upon initiation of RLE advancement. RLE at times hyperextending, cues for positional control with stop/restart and midline cueing. Gait Pattern/deviations: Step-through pattern, Decreased stride length, Ataxic, Decreased weight shift to right Gait velocity interpretation: Below normal speed for age/gender    ADL: ADL Overall ADL's : Needs assistance/impaired Eating/Feeding: NPO Eating/Feeding Details (indicate cue type and reason): pt [name] Grooming: Oral care, Wash/dry face, Moderate assistance, Sitting Grooming Details (indicate cue type and reason): noted R ue weakness decr grasp, attempting to wipe drool from poor lip closure Upper Body Bathing: Moderate assistance, Bed level Lower Body Bathing: Total assistance Toilet Transfer: +2 for physical assistance, Moderate assistance Functional mobility during ADLs: +2 for physical assistance, Moderate assistance General ADL Comments: Pt very motivated to ambulate and get progressing with therapy on arrival.pt wants ice chips for oral trials. Pt and family educated taht SLP  will arrive today.   Cognition: Cognition Overall Cognitive Status: Within Functional Limits for tasks assessed Orientation Level: Oriented X4 Cognition Arousal/Alertness: Awake/alert Behavior During Therapy: WFL for tasks assessed/performed Overall Cognitive Status: Within Functional Limits for tasks assessed  Physical Exam: Blood pressure 135/59, pulse 80, temperature 97.6 F (36.4 C), temperature source Oral, resp. rate 22, weight 81.8 kg (180 lb 5.4 oz), SpO2 97 %. Physical Exam Constitutional:  81 year old right handed Caucasian male.  HENT:  Mild right facial droop  Eyes:  Pupils reactive to light Neck: Normal range of motion. Neck supple. No thyromegaly present.  Cardiovascular: Normal rate and regular rhythm.mild systolic murmur Respiratory:  Limited inspiratory effort but clear to auscultation. No wheezes or rales GI: Soft. Bowel sounds are normal. He exhibits no distension.  Neurological: He is alert.  Speech is severely dysarthric but intelligible--improved intelligibility today. Still with fairly weak cough. He does follow simple commands. Limited vision to left hemisphere. has difficulty tracking to the left past midline. INO(old deficit).  He is able to provide his name age as well as date of birth. RUE 3+/5 prox to distal. RLE 3+ RHF,KE to 4/5 ADF/APF. LUE 4/5. LLE 4- to 4/5. Decreased LT RUE.  Skin: Skin is warm and dry.  Psychiatric:  Pleasant, cooperative. More dynamic today.     Lab Results Last 48 Hours    Results for orders placed or performed during the hospital encounter of 01/09/15 (from the past 48 hour(s))  Glucose, capillary Status: Abnormal   Collection Time: 01/11/15 11:20 AM  Result Value Ref Range   Glucose-Capillary 102 (H) 70 - 99 mg/dL  Glucose, capillary Status: Abnormal   Collection Time: 01/11/15 4:28 PM  Result Value Ref Range   Glucose-Capillary 109 (H) 70 - 99  mg/dL  Glucose, capillary  Status: Abnormal   Collection Time: 01/11/15 11:35 PM  Result Value Ref Range   Glucose-Capillary 111 (H) 70 - 99 mg/dL   Comment 1 Notify RN    Comment 2 Document in Chart   Glucose, capillary Status: Abnormal   Collection Time: 01/12/15 4:02 AM  Result Value Ref Range   Glucose-Capillary 143 (H) 70 - 99 mg/dL   Comment 1 Notify RN    Comment 2 Document in Chart   Glucose, capillary Status: Abnormal   Collection Time: 01/12/15 7:22 AM  Result Value Ref Range   Glucose-Capillary 142 (H) 70 - 99 mg/dL   Comment 1 Document in Chart   Glucose, capillary Status: Abnormal   Collection Time: 01/12/15 11:39 AM  Result Value Ref Range   Glucose-Capillary 134 (H) 70 - 99 mg/dL   Comment 1 Document in Chart   Glucose, capillary Status: Abnormal   Collection Time: 01/12/15 5:01 PM  Result Value Ref Range   Glucose-Capillary 113 (H) 70 - 99 mg/dL  Glucose, capillary Status: Abnormal   Collection Time: 01/12/15 9:03 PM  Result Value Ref Range   Glucose-Capillary 145 (H) 70 - 99 mg/dL  Glucose, capillary Status: Abnormal   Collection Time: 01/13/15 12:48 AM  Result Value Ref Range   Glucose-Capillary 112 (H) 70 - 99 mg/dL   Comment 1 Notify RN    Comment 2 Document in Chart   Glucose, capillary Status: Abnormal   Collection Time: 01/13/15 5:01 AM  Result Value Ref Range   Glucose-Capillary 135 (H) 70 - 99 mg/dL   Comment 1 Notify RN    Comment 2 Document in Chart   Glucose, capillary Status: Abnormal   Collection Time: 01/13/15 7:54 AM  Result Value Ref Range   Glucose-Capillary 139 (H) 70 - 99 mg/dL      Imaging Results (Last 48 hours)    No results found.       Medical Problem List and Plan: 1. Functional deficits secondary to cardioembolic bi-cerebral CVA after cardiac catheterization with stenting 2. DVT  Prophylaxis/Anticoagulation: Subcutaneous heparin. Monitor platelet count 90 signs of bleeding 3. Pain Management: Tylenol as needed 4. Dysphagia. dysphagia 1 nectar liquids. Follow-up speech therapy 5. Neuropsych: This patient is capable of making decisions on his own behalf. 6. Skin/Wound Care: Routine skin checks 7. Fluids/Electrolytes/Nutrition: Strict I and O's with follow-up chemistries 8. Hyperlipidemia. Lipitor 9. Hypertension. Currently on no antihypertensive medication. Patient on Cozaar 100 mg daily prior to admission. Monitor with increased mobility    Post Admission Physician Evaluation: 1. Functional deficits secondary to embolic bi-cerebral infarcts. 2. Patient is admitted to receive collaborative, interdisciplinary care between the physiatrist, rehab nursing staff, and therapy team. 3. Patient's level of medical complexity and substantial therapy needs in context of that medical necessity cannot be provided at a lesser intensity of care such as a SNF. 4. Patient has experienced substantial functional loss from his/her baseline which was documented above under the "Functional History" and "Functional Status" headings. Judging by the patient's diagnosis, physical exam, and functional history, the patient has potential for functional progress which will result in measurable gains while on inpatient rehab. These gains will be of substantial and practical use upon discharge in facilitating mobility and self-care at the household level. 5. Physiatrist will provide 24 hour management of medical needs as well as oversight of the therapy plan/treatment and provide guidance as appropriate regarding the interaction of the two. 6. 24 hour rehab nursing will assist with bladder  management, bowel management, safety, skin/wound care, disease management, medication administration, pain management and patient education and help integrate therapy concepts, techniques,education, etc. 7. PT will  assess and treat for/with: Lower extremity strength, range of motion, stamina, balance, functional mobility, safety, adaptive techniques and equipment, NMR, cognitive perceptual rx,  Goals are: supervision. 8. OT will assess and treat for/with: ADL's, functional mobility, safety, upper extremity strength, adaptive techniques and equipment, NMR, visual perceptual rx, stroke education, family education. Goals are: supervision. Therapy may proceed with showering this patient. 9. SLP will assess and treat for/with: speech, cognition, swallowing, communication. Goals are: supervision to mod I. 10. Case Management and Social Worker will assess and treat for psychological issues and discharge planning. 11. Team conference will be held weekly to assess progress toward goals and to determine barriers to discharge. 12. Patient will receive at least 3 hours of therapy per day at least 5 days per week. 13. ELOS: 16-21 days  14. Prognosis: excellent     Ranelle OysterZachary T. Swartz, MD, [facility] 01/14/2015

## 2015-01-14 NOTE — Progress Notes (Signed)
I await insurance approval to admit pt to inpt rehab today. I will follow up today. [phone]

## 2015-01-15 ENCOUNTER — Inpatient Hospital Stay (HOSPITAL_COMMUNITY): Payer: Medicare Other | Admitting: Occupational Therapy

## 2015-01-15 ENCOUNTER — Inpatient Hospital Stay (HOSPITAL_COMMUNITY): Payer: Medicare Other | Admitting: Speech Pathology

## 2015-01-15 ENCOUNTER — Inpatient Hospital Stay (HOSPITAL_COMMUNITY): Payer: Medicare Other | Admitting: Rehabilitation

## 2015-01-15 ENCOUNTER — Inpatient Hospital Stay (HOSPITAL_COMMUNITY): Payer: Medicare Other

## 2015-01-15 ENCOUNTER — Inpatient Hospital Stay (HOSPITAL_COMMUNITY)
Admission: AD | Admit: 2015-01-15 | Discharge: 2015-02-11 | DRG: 208 | Disposition: E | Payer: Medicare Other | Source: Ambulatory Visit | Attending: Pulmonary Disease | Admitting: Pulmonary Disease

## 2015-01-15 DIAGNOSIS — I69391 Dysphagia following cerebral infarction: Secondary | ICD-10-CM

## 2015-01-15 DIAGNOSIS — I2583 Coronary atherosclerosis due to lipid rich plaque: Secondary | ICD-10-CM

## 2015-01-15 DIAGNOSIS — I69359 Hemiplegia and hemiparesis following cerebral infarction affecting unspecified side: Secondary | ICD-10-CM

## 2015-01-15 DIAGNOSIS — I82409 Acute embolism and thrombosis of unspecified deep veins of unspecified lower extremity: Secondary | ICD-10-CM | POA: Diagnosis present

## 2015-01-15 DIAGNOSIS — Z515 Encounter for palliative care: Secondary | ICD-10-CM | POA: Diagnosis not present

## 2015-01-15 DIAGNOSIS — I2699 Other pulmonary embolism without acute cor pulmonale: Secondary | ICD-10-CM | POA: Diagnosis present

## 2015-01-15 DIAGNOSIS — R570 Cardiogenic shock: Secondary | ICD-10-CM | POA: Insufficient documentation

## 2015-01-15 DIAGNOSIS — Z9289 Personal history of other medical treatment: Secondary | ICD-10-CM

## 2015-01-15 DIAGNOSIS — J9690 Respiratory failure, unspecified, unspecified whether with hypoxia or hypercapnia: Secondary | ICD-10-CM

## 2015-01-15 DIAGNOSIS — I69322 Dysarthria following cerebral infarction: Secondary | ICD-10-CM | POA: Diagnosis not present

## 2015-01-15 DIAGNOSIS — E872 Acidosis: Secondary | ICD-10-CM | POA: Diagnosis not present

## 2015-01-15 DIAGNOSIS — M7989 Other specified soft tissue disorders: Secondary | ICD-10-CM | POA: Diagnosis not present

## 2015-01-15 DIAGNOSIS — Z955 Presence of coronary angioplasty implant and graft: Secondary | ICD-10-CM | POA: Diagnosis not present

## 2015-01-15 DIAGNOSIS — Z87891 Personal history of nicotine dependence: Secondary | ICD-10-CM | POA: Diagnosis not present

## 2015-01-15 DIAGNOSIS — I82491 Acute embolism and thrombosis of other specified deep vein of right lower extremity: Secondary | ICD-10-CM | POA: Diagnosis not present

## 2015-01-15 DIAGNOSIS — I638 Other cerebral infarction: Secondary | ICD-10-CM | POA: Diagnosis not present

## 2015-01-15 DIAGNOSIS — I739 Peripheral vascular disease, unspecified: Secondary | ICD-10-CM | POA: Diagnosis present

## 2015-01-15 DIAGNOSIS — J9601 Acute respiratory failure with hypoxia: Secondary | ICD-10-CM | POA: Diagnosis present

## 2015-01-15 DIAGNOSIS — I959 Hypotension, unspecified: Secondary | ICD-10-CM | POA: Diagnosis not present

## 2015-01-15 DIAGNOSIS — N179 Acute kidney failure, unspecified: Secondary | ICD-10-CM | POA: Diagnosis not present

## 2015-01-15 DIAGNOSIS — I469 Cardiac arrest, cause unspecified: Secondary | ICD-10-CM

## 2015-01-15 DIAGNOSIS — Z7982 Long term (current) use of aspirin: Secondary | ICD-10-CM

## 2015-01-15 DIAGNOSIS — Z66 Do not resuscitate: Secondary | ICD-10-CM | POA: Diagnosis present

## 2015-01-15 DIAGNOSIS — I63012 Cerebral infarction due to thrombosis of left vertebral artery: Secondary | ICD-10-CM | POA: Diagnosis not present

## 2015-01-15 DIAGNOSIS — I2510 Atherosclerotic heart disease of native coronary artery without angina pectoris: Secondary | ICD-10-CM | POA: Diagnosis present

## 2015-01-15 DIAGNOSIS — R579 Shock, unspecified: Secondary | ICD-10-CM

## 2015-01-15 DIAGNOSIS — I9589 Other hypotension: Secondary | ICD-10-CM

## 2015-01-15 DIAGNOSIS — I69320 Aphasia following cerebral infarction: Secondary | ICD-10-CM | POA: Diagnosis not present

## 2015-01-15 DIAGNOSIS — G4733 Obstructive sleep apnea (adult) (pediatric): Secondary | ICD-10-CM | POA: Diagnosis present

## 2015-01-15 DIAGNOSIS — R55 Syncope and collapse: Secondary | ICD-10-CM | POA: Diagnosis present

## 2015-01-15 DIAGNOSIS — R7989 Other specified abnormal findings of blood chemistry: Secondary | ICD-10-CM

## 2015-01-15 DIAGNOSIS — E785 Hyperlipidemia, unspecified: Secondary | ICD-10-CM | POA: Diagnosis present

## 2015-01-15 DIAGNOSIS — I517 Cardiomegaly: Secondary | ICD-10-CM

## 2015-01-15 DIAGNOSIS — Z452 Encounter for adjustment and management of vascular access device: Secondary | ICD-10-CM

## 2015-01-15 DIAGNOSIS — I10 Essential (primary) hypertension: Secondary | ICD-10-CM | POA: Diagnosis present

## 2015-01-15 DIAGNOSIS — R778 Other specified abnormalities of plasma proteins: Secondary | ICD-10-CM | POA: Insufficient documentation

## 2015-01-15 DIAGNOSIS — G9340 Encephalopathy, unspecified: Secondary | ICD-10-CM | POA: Diagnosis present

## 2015-01-15 DIAGNOSIS — Z7902 Long term (current) use of antithrombotics/antiplatelets: Secondary | ICD-10-CM

## 2015-01-15 DIAGNOSIS — J9600 Acute respiratory failure, unspecified whether with hypoxia or hypercapnia: Secondary | ICD-10-CM | POA: Diagnosis present

## 2015-01-15 DIAGNOSIS — I97610 Postprocedural hemorrhage and hematoma of a circulatory system organ or structure following a cardiac catheterization: Secondary | ICD-10-CM | POA: Diagnosis not present

## 2015-01-15 LAB — COMPREHENSIVE METABOLIC PANEL
ALBUMIN: 2.7 g/dL — AB (ref 3.5–5.0)
ALT: 16 U/L — ABNORMAL LOW (ref 17–63)
ALT: 345 U/L — ABNORMAL HIGH (ref 17–63)
AST: 18 U/L (ref 15–41)
AST: 273 U/L — ABNORMAL HIGH (ref 15–41)
Albumin: 2.6 g/dL — ABNORMAL LOW (ref 3.5–5.0)
Alkaline Phosphatase: 52 U/L (ref 38–126)
Alkaline Phosphatase: 72 U/L (ref 38–126)
Anion gap: 8 (ref 5–15)
Anion gap: 13 (ref 5–15)
BUN: 21 mg/dL — ABNORMAL HIGH (ref 6–20)
BUN: 35 mg/dL — ABNORMAL HIGH (ref 6–20)
CHLORIDE: 108 mmol/L (ref 101–111)
CO2: 25 mmol/L (ref 22–32)
CO2: 16 mmol/L — AB (ref 22–32)
Calcium: 8.7 mg/dL — ABNORMAL LOW (ref 8.9–10.3)
Calcium: 8.2 mg/dL — ABNORMAL LOW (ref 8.9–10.3)
Chloride: 111 mmol/L (ref 101–111)
Creatinine, Ser: 0.88 mg/dL (ref 0.61–1.24)
Creatinine, Ser: 2.03 mg/dL — ABNORMAL HIGH (ref 0.61–1.24)
GFR calc Af Amer: >60 mL/min (ref >60)
GFR calc non Af Amer: 29 mL/min — ABNORMAL LOW (ref >60)
GFR, EST AFRICAN AMERICAN: 34 mL/min — AB (ref >60)
GLUCOSE: 102 mg/dL — AB (ref 70–99)
GLUCOSE: 175 mg/dL — AB (ref 70–99)
POTASSIUM: 3.8 mmol/L (ref 3.5–5.1)
POTASSIUM: 4.4 mmol/L (ref 3.5–5.1)
SODIUM: 141 mmol/L (ref 135–145)
Sodium: 140 mmol/L (ref 135–145)
TOTAL PROTEIN: 5.6 g/dL — AB (ref 6.5–8.1)
Total Bilirubin: 0.8 mg/dL (ref 0.3–1.2)
Total Bilirubin: 1.3 mg/dL — ABNORMAL HIGH (ref 0.3–1.2)
Total Protein: 5.7 g/dL — ABNORMAL LOW (ref 6.5–8.1)

## 2015-01-15 LAB — CBC WITH DIFFERENTIAL/PLATELET
BASOS PCT: 0 % (ref 0–1)
Basophils Absolute: 0.1 10*3/uL (ref 0.0–0.1)
Basophils Absolute: 0 10*3/uL (ref 0.0–0.1)
Basophils Absolute: 0 10*3/uL (ref 0.0–0.1)
Basophils Relative: 1 % (ref 0–1)
Basophils Relative: 0 % (ref 0–1)
EOS ABS: 0 10*3/uL (ref 0.0–0.7)
EOS PCT: 0 % (ref 0–5)
EOS PCT: 0 % (ref 0–5)
Eosinophils Absolute: 0.2 10*3/uL (ref 0.0–0.7)
Eosinophils Absolute: 0 10*3/uL (ref 0.0–0.7)
Eosinophils Relative: 4 % (ref 0–5)
HCT: 33.8 % — ABNORMAL LOW (ref 39.0–52.0)
HCT: 36.8 % — ABNORMAL LOW (ref 39.0–52.0)
HCT: 34.6 % — ABNORMAL LOW (ref 39.0–52.0)
HEMOGLOBIN: 12.7 g/dL — AB (ref 13.0–17.0)
Hemoglobin: 11.7 g/dL — ABNORMAL LOW (ref 13.0–17.0)
Hemoglobin: 12 g/dL — ABNORMAL LOW (ref 13.0–17.0)
LYMPHS ABS: 1.6 10*3/uL (ref 0.7–4.0)
LYMPHS PCT: 17 % (ref 12–46)
LYMPHS PCT: 15 % (ref 12–46)
Lymphocytes Relative: 11 % — ABNORMAL LOW (ref 12–46)
Lymphs Abs: 1.1 10*3/uL (ref 0.7–4.0)
Lymphs Abs: 1.8 10*3/uL (ref 0.7–4.0)
MCH: 32 pg (ref 26.0–34.0)
MCH: 32.2 pg (ref 26.0–34.0)
MCH: 32.6 pg (ref 26.0–34.0)
MCHC: 34.6 g/dL (ref 30.0–36.0)
MCHC: 34.5 g/dL (ref 30.0–36.0)
MCHC: 34.7 g/dL (ref 30.0–36.0)
MCV: 92.3 fL (ref 78.0–100.0)
MCV: 93.4 fL (ref 78.0–100.0)
MCV: 94 fL (ref 78.0–100.0)
MONO ABS: 1.3 10*3/uL — AB (ref 0.1–1.0)
MONOS PCT: 12 % (ref 3–12)
MONOS PCT: 9 % (ref 3–12)
Monocytes Absolute: 0.8 10*3/uL (ref 0.1–1.0)
Monocytes Absolute: 1.4 10*3/uL — ABNORMAL HIGH (ref 0.1–1.0)
Monocytes Relative: 12 % (ref 3–12)
NEUTROS ABS: 4.2 10*3/uL (ref 1.7–7.7)
NEUTROS ABS: 12 10*3/uL — AB (ref 1.7–7.7)
Neutro Abs: 8.5 10*3/uL — ABNORMAL HIGH (ref 1.7–7.7)
Neutrophils Relative %: 66 % (ref 43–77)
Neutrophils Relative %: 73 % (ref 43–77)
Neutrophils Relative %: 80 % — ABNORMAL HIGH (ref 43–77)
PLATELETS: 234 10*3/uL (ref 150–400)
Platelets: 348 10*3/uL (ref 150–400)
Platelets: 384 10*3/uL (ref 150–400)
RBC: 3.66 MIL/uL — AB (ref 4.22–5.81)
RBC: 3.94 MIL/uL — ABNORMAL LOW (ref 4.22–5.81)
RBC: 3.68 MIL/uL — ABNORMAL LOW (ref 4.22–5.81)
RDW: 12.9 % (ref 11.5–15.5)
RDW: 13.2 % (ref 11.5–15.5)
RDW: 13.4 % (ref 11.5–15.5)
WBC: 6.3 10*3/uL (ref 4.0–10.5)
WBC: 11.7 10*3/uL — AB (ref 4.0–10.5)
WBC: 14.9 10*3/uL — AB (ref 4.0–10.5)

## 2015-01-15 LAB — BASIC METABOLIC PANEL
Anion gap: 17 — ABNORMAL HIGH (ref 5–15)
BUN: 33 mg/dL — ABNORMAL HIGH (ref 6–20)
CALCIUM: 8.9 mg/dL (ref 8.9–10.3)
CO2: 16 mmol/L — ABNORMAL LOW (ref 22–32)
Chloride: 109 mmol/L (ref 101–111)
Creatinine, Ser: 1.66 mg/dL — ABNORMAL HIGH (ref 0.61–1.24)
GFR, EST AFRICAN AMERICAN: 44 mL/min — AB (ref >60)
GFR, EST NON AFRICAN AMERICAN: 38 mL/min — AB (ref >60)
Glucose, Bld: 154 mg/dL — ABNORMAL HIGH (ref 70–99)
Potassium: 4.5 mmol/L (ref 3.5–5.1)
SODIUM: 142 mmol/L (ref 135–145)

## 2015-01-15 LAB — GLUCOSE, CAPILLARY: Glucose-Capillary: 162 mg/dL — ABNORMAL HIGH (ref 70–99)

## 2015-01-15 LAB — APTT: aPTT: 183 seconds — ABNORMAL HIGH (ref 24–37)

## 2015-01-15 LAB — POCT I-STAT 3, ART BLOOD GAS (G3+)
Acid-base deficit: 6 mmol/L — ABNORMAL HIGH (ref 0.0–2.0)
Bicarbonate: 18.8 mEq/L — ABNORMAL LOW (ref 20.0–24.0)
O2 SAT: 100 %
PCO2 ART: 32.5 mmHg — AB (ref 35.0–45.0)
PH ART: 7.37 (ref 7.350–7.450)
Patient temperature: 98.7
TCO2: 20 mmol/L (ref 0–100)
pO2, Arterial: 297 mmHg — ABNORMAL HIGH (ref 80.0–100.0)

## 2015-01-15 LAB — MRSA PCR SCREENING: MRSA BY PCR: NEGATIVE

## 2015-01-15 LAB — TYPE AND SCREEN
ABO/RH(D): O NEG
ANTIBODY SCREEN: NEGATIVE

## 2015-01-15 LAB — PHOSPHORUS: PHOSPHORUS: 6.9 mg/dL — AB (ref 2.5–4.6)

## 2015-01-15 LAB — PROTIME-INR
INR: 1.39 (ref 0.00–1.49)
Prothrombin Time: 17.2 seconds — ABNORMAL HIGH (ref 11.6–15.2)

## 2015-01-15 LAB — BRAIN NATRIURETIC PEPTIDE: B NATRIURETIC PEPTIDE 5: 183.2 pg/mL — AB (ref 0.0–100.0)

## 2015-01-15 LAB — MAGNESIUM: Magnesium: 2 mg/dL (ref 1.7–2.4)

## 2015-01-15 LAB — TROPONIN I: TROPONIN I: 3.26 ng/mL — AB (ref <0.031)

## 2015-01-15 LAB — LACTIC ACID, PLASMA: LACTIC ACID, VENOUS: 2.8 mmol/L — AB (ref 0.5–2.0)

## 2015-01-15 LAB — D-DIMER, QUANTITATIVE: D-Dimer, Quant: 15.8 ug/mL-FEU — ABNORMAL HIGH (ref 0.00–0.48)

## 2015-01-15 MED ORDER — SODIUM CHLORIDE 0.9 % IV BOLUS (SEPSIS)
2000.0000 mL | Freq: Once | INTRAVENOUS | Status: AC
  Administered 2015-01-15: 2000 mL via INTRAVENOUS

## 2015-01-15 MED ORDER — GUAIFENESIN ER 600 MG PO TB12
600.0000 mg | ORAL_TABLET | Freq: Two times a day (BID) | ORAL | Status: DC
  Filled 2015-01-15 (×2): qty 1

## 2015-01-15 MED ORDER — SODIUM CHLORIDE 0.9 % IV SOLN
INTRAVENOUS | Status: DC
  Administered 2015-01-15: 21:00:00 via INTRAVENOUS

## 2015-01-15 MED ORDER — CLOPIDOGREL BISULFATE 75 MG PO TABS
75.0000 mg | ORAL_TABLET | Freq: Every day | ORAL | Status: DC
  Administered 2015-01-16: 75 mg via ORAL
  Filled 2015-01-15: qty 1

## 2015-01-15 MED ORDER — HEPARIN BOLUS VIA INFUSION
4000.0000 [IU] | Freq: Once | INTRAVENOUS | Status: DC
  Filled 2015-01-15: qty 4000

## 2015-01-15 MED ORDER — SODIUM CHLORIDE 0.45 % IV SOLN: INTRAVENOUS | Status: DC

## 2015-01-15 MED ORDER — HEPARIN (PORCINE) IN NACL 100-0.45 UNIT/ML-% IJ SOLN
1000.0000 [IU]/h | INTRAMUSCULAR | Status: DC
Start: 2015-01-15 — End: 2015-01-16
  Administered 2015-01-15: 1100 [IU]/h via INTRAVENOUS
  Administered 2015-01-16: 1000 [IU]/h via INTRAVENOUS
  Filled 2015-01-15 (×3): qty 250

## 2015-01-15 MED ORDER — ASPIRIN 81 MG PO CHEW
81.0000 mg | CHEWABLE_TABLET | Freq: Every day | ORAL | Status: DC
  Administered 2015-01-16: 81 mg via ORAL
  Filled 2015-01-15: qty 1

## 2015-01-15 MED ORDER — NOREPINEPHRINE BITARTRATE 1 MG/ML IV SOLN
5.0000 ug/min | INTRAVENOUS | Status: DC
  Administered 2015-01-15: 15 ug/min via INTRAVENOUS
  Filled 2015-01-15: qty 4

## 2015-01-15 MED ORDER — PANTOPRAZOLE SODIUM 40 MG IV SOLR
40.0000 mg | Freq: Every day | INTRAVENOUS | Status: DC
  Administered 2015-01-15: 40 mg via INTRAVENOUS
  Filled 2015-01-15 (×2): qty 40

## 2015-01-15 MED ORDER — SODIUM CHLORIDE 0.9 % IV SOLN: 250.0000 mL | INTRAVENOUS | Status: DC | PRN

## 2015-01-15 NOTE — Progress Notes (Signed)
Occupational Therapy Session Note  Patient Details  Name: Jerome Bowen MRN: [id number] Date of Birth: 1933-09-30  Today's Date: 01/22/2015 OT Missed Time: 90 Minutes Missed Time Reason: MD hold (comment)     Skilled Therapeutic Interventions/Progress Updates:   OT acknowledged order but OT evaluation not completed other than chart review performed. Pt currently on MD hold and missed 90 minutes of scheduled OT intervention.     Lowella Gripittman, Shahzaib Azevedo L 01/31/2015, 12:41 PM

## 2015-01-15 NOTE — Progress Notes (Signed)
SLP Cancellation Note  Patient Details Name: Jerome Bowen MRN: [id number] DOB: 1934-04-08   Cancelled treatment:       Patient missed 60 minutes of skilled SLP evaluation due to being placed on medical hold.                                                                                                Mareesa Gathright 01/29/2015, 3:08 PM

## 2015-01-15 NOTE — Progress Notes (Signed)
Physical Medicine and Rehabilitation Consult Reason for Consult: Acute bilateral infarcts brainstem, bilateral occipital lobe, left parietal lobe Referring Physician: [facility]   HPI: Jerome Bowen is a 81 y.o. right handed male with history of hypertension, hyperlipidemia and angina. Patient underwent elective cardiac cath for chest pain with abnormal stress test findings of distal RCA stenosis underwent stenting 01/08/2015 at [facility]. Patient independent prior to admission living with [place] in [place]. Following the procedure noted to be agitated as well as difficulty speaking. MRI obtained which showed multifocal infarcts in both anterior and posterior circulation consistent with cardiac embolus transferred to [facility]. Patient also had developed postoperative hematoma after cardiac catheterization. Did not receive TPA. Echocardiogram with ejection fraction of 70% systolic function was vigorous. MRI of the brain without stenosis. Neurology consulted presently maintained on aspirin and Plavix therapy. Septae separate for DVT prophylaxis. Patient is nothing by mouth with nasogastric tube feeds after a failed modified barium swallow. Physical therapy evaluation completed with recommendations of physical medicine rehabilitation consult.   Review of Systems  Eyes: Positive for blurred vision.  Respiratory: Positive for shortness of breath.  Cardiovascular: Positive for chest pain.  Genitourinary: Positive for urgency.  All other systems reviewed and are negative.  Past Medical History  Diagnosis Date  . Essential hypertension   . Dyslipidemia   . CAD (coronary artery disease)   . S/P right coronary artery (RCA) stent placement   . OSA (obstructive sleep apnea)   . Esophageal stricture   . Seronegative arthritis   . Fatty liver    Past Surgical History  Procedure Laterality Date  . Cardiac catheterization     . Colonoscopy     Family History  Problem Relation Age of Onset  . Heart disease Brother    Social History:  reports that he has quit smoking. His smoking use included Cigarettes. He has quit using smokeless tobacco. He reports that he drinks alcohol. He reports that he does not use illicit drugs. Allergies: No Known Allergies Medications Prior to Admission  Medication Sig Dispense Refill  . aspirin EC 325 MG tablet Take 325 mg by mouth daily.    . cholecalciferol (VITAMIN D) 1000 UNITS tablet Take 1,000 Units by mouth daily.    . clopidogrel (PLAVIX) 75 MG tablet Take 75 mg by mouth daily.    . Cyanocobalamin 1000 MCG TBCR Take 1 tablet by mouth daily.    Marland Kitchen. losartan (COZAAR) 100 MG tablet Take 100 mg by mouth daily.    . tamsulosin (FLOMAX) 0.4 MG CAPS capsule Take 0.4 mg by mouth daily.     . vitamin E 1000 UNIT capsule Take 1,000 Units by mouth daily.      Home: Home Living Family/patient expects to be discharged to:: Private residence Living Arrangements: Spouse/significant other Available Help at Discharge: Family Type of Home: House Home Access: Stairs to enter Secretary/administratorntrance Stairs-Number of Steps: 2 Entrance Stairs-Rails: Right Home Layout: Two level, Able to live on main level with bedroom/bathroom Home Equipment: None Lives With: Spouse  Functional History: Prior Function Level of Independence: Independent Functional Status:  Mobility: Bed Mobility Overal bed mobility: Needs Assistance, +2 for physical assistance Bed Mobility: Supine to Sit, Sit to Supine, Rolling Rolling: Min assist Supine to sit: Mod assist Sit to supine: +2 for physical assistance, Min assist General bed mobility comments: in chair on arrival. Wife and pt report extended time in chair for comfort with secretions Transfers Overall transfer level: Needs  assistance Equipment used: Rolling walker (2 wheeled), 2 person hand held assist Transfers:  Sit to/from Stand Sit to Stand: +2 physical assistance, Mod assist General transfer comment: performed x3, verbal cues for hand placement Ambulation/Gait Ambulation/Gait assistance: Mod assist, +2 physical assistance (1 person max assist with progression through gait training) Ambulation Distance (Feet): 50 Feet (X2, with increments of 10- 20 ft of tactile gait retraining) Assistive device: 2 person hand held assist Adult nurse(Rolling Walker with progression of gait) General Gait Details: +2 physical assist for gait training, decreased proprioception for RLE stride placement, improvements noted with facilitation of upright posture and tactile cues for weight shift to the left upon initiation of RLE advancement. RLE at times hyperextending, cues for positional control with stop/restart and midline cueing. Gait Pattern/deviations: Step-through pattern, Decreased stride length, Ataxic, Decreased weight shift to right Gait velocity interpretation: Below normal speed for age/gender    ADL: ADL Overall ADL's : Needs assistance/impaired Eating/Feeding: NPO Eating/Feeding Details (indicate cue type and reason): pt wanting ice chips like at [facility] Grooming: Oral care, Wash/dry face, Moderate assistance, Sitting Grooming Details (indicate cue type and reason): noted R ue weakness decr grasp, attempting to wipe drool from poor lip closure Upper Body Bathing: Moderate assistance, Bed level Lower Body Bathing: Total assistance Toilet Transfer: +2 for physical assistance, Moderate assistance Functional mobility during ADLs: +2 for physical assistance, Moderate assistance General ADL Comments: Pt very motivated to ambulate and get progressing with therapy on arrival.pt wants ice chips for oral trials. Pt and family educated taht SLP will arrive today.   Cognition: Cognition Overall Cognitive Status: Within Functional Limits for tasks assessed Orientation Level: Oriented X4 Cognition Arousal/Alertness:  Awake/alert Behavior During Therapy: WFL for tasks assessed/performed Overall Cognitive Status: Within Functional Limits for tasks assessed  Blood pressure 135/70, pulse 78, temperature 99 F (37.2 C), temperature source Oral, resp. rate 18, weight 79.3 kg (174 lb 13.2 oz), SpO2 97 %. Physical Exam  Vitals reviewed. Constitutional:  81 year old right handed Caucasian male.  HENT:  Mild right facial droop  Eyes:  Pupils reactive to light without nystagmus  Neck: Normal range of motion. Neck supple. No thyromegaly present.  Cardiovascular: Normal rate and regular rhythm.  Respiratory:  Limited inspiratory effort but clear to auscultation  GI: Soft. Bowel sounds are normal. He exhibits no distension.  Neurological: He is alert.  Speech is severely dysarthric but intelligible. He does follow simple commands. Limited vision to left hemisphere. He is able to provide his name age as well as date of birth. RUE 3+/5. RLE 3+ to 4/5. LUE 4/5. LLE 4- to 4/5. Decreased LT RUE.  Skin: Skin is warm and dry.  Psychiatric:  Pleasant, cooperative     Lab Results Last 24 Hours    Results for orders placed or performed during the hospital encounter of 01/09/15 (from the past 24 hour(s))  Glucose, capillary Status: Abnormal   Collection Time: 01/11/15 4:28 PM  Result Value Ref Range   Glucose-Capillary 109 (H) 70 - 99 mg/dL  Glucose, capillary Status: Abnormal   Collection Time: 01/11/15 11:35 PM  Result Value Ref Range   Glucose-Capillary 111 (H) 70 - 99 mg/dL   Comment 1 Notify RN    Comment 2 Document in Chart   Glucose, capillary Status: Abnormal   Collection Time: 01/12/15 4:02 AM  Result Value Ref Range   Glucose-Capillary 143 (H) 70 - 99 mg/dL   Comment 1 Notify RN    Comment 2 Document in Chart   Glucose, capillary  Status: Abnormal   Collection Time: 01/12/15 7:22 AM  Result Value Ref Range    Glucose-Capillary 142 (H) 70 - 99 mg/dL   Comment 1 Document in Chart       Imaging Results (Last 48 hours)    Dg Swallowing Func-speech Pathology  01/10/2015 Objective Swallowing Evaluation: Patient Details Name: Jerome Bowen MRN: [id number] Date of Birth: 1934/07/02 Today's Date: 01/10/2015 Time: SLP Start Time (ACUTE ONLY): 1430-SLP Stop Time (ACUTE ONLY): 1500 SLP Time Calculation (min) (ACUTE ONLY): 30 min Past Medical History: Past Medical History Diagnosis Date . Essential hypertension . Dyslipidemia . CAD (coronary artery disease) . S/P right coronary artery (RCA) stent placement . OSA (obstructive sleep apnea) . Esophageal stricture . Seronegative arthritis . Fatty liver Past Surgical History: No past surgical history on file. HPI: Other Pertinent Information: Jerome Bowen is a 81 y.o. male with past medical history of hypertension, dyslipidemia, coronary artery disease, obstructive sleep apnea on C Pap, seronegative arthritis. The patient was brought in from [facility]. No Data Recorded Assessment / Plan / Recommendation CHL IP CLINICAL IMPRESSIONS 01/10/2015 Therapy Diagnosis Severe oral phase dysphagia;Moderate pharyngeal phase dysphagia Clinical Impression MBSS completed. Pt presents with severe oral and moderate pharyngeal dysphagia c/b severe oral discoordination and poor a/p transit of the bolus. The pt was unable to adequately manipulate thins or purees. The entire thin bolus was anteriorly lost and the pt was unable to move the puree bolus into the pharynx requiring oral suctioning. Pharyngeally the pt presented with a delayed swallow trigger, decreased constriction with mild vallecular and pyriform residue and decreased epiglottic deflection with tsp boluses of honey. Given the pt's current swallowing ability rx keep the pt NPO. Rx therapeutic feeds of honey liquids via tsp sips with the SLP only. The pt will require intense ST  f/u at the next level of care. CHL IP TREATMENT RECOMMENDATION 01/10/2015 Treatment Recommendations Therapy as outlined in treatment plan below CHL IP DIET RECOMMENDATION 01/10/2015 SLP Diet Recommendations NPO Liquid Administration via (None) Medication Administration Via alternative means Compensations (None) Postural Changes and/or Swallow Maneuvers (None) CHL IP OTHER RECOMMENDATIONS 01/10/2015 Recommended Consults (None) Oral Care Recommendations Oral care BID Other Recommendations (None) CHL IP FOLLOW UP RECOMMENDATIONS 01/10/2015 Follow up Recommendations Inpatient Rehab CHL IP FREQUENCY AND DURATION 01/10/2015 Speech Therapy Frequency (ACUTE ONLY) min 2x/week Treatment Duration 2 weeks CHL IP REASON FOR REFERRAL 01/10/2015 Reason for Referral Objectively evaluate swallowing function CHL IP ORAL PHASE 01/10/2015 Lips (None) Tongue (None) Mucous membranes (None) Nutritional status (None) Other (None) Oxygen therapy (None) Oral Phase Impaired Oral - Pudding Teaspoon (None) Oral - Pudding Cup (None) Oral - Honey Teaspoon (None) Oral - Honey Cup (None) Oral - Honey Syringe (None) Oral - Nectar Teaspoon (None) Oral - Nectar Cup (None) Oral - Nectar Straw (None) Oral - Nectar Syringe (None) Oral - Ice Chips (None) Oral - Thin Teaspoon (None) Oral - Thin Cup (None) Oral - Thin Straw (None) Oral - Thin Syringe (None) Oral - Puree (None) Oral - Mechanical Soft (None) Oral - Regular (None) Oral - Multi-consistency (None) Oral - Pill (None) Oral Phase - Comment (None) CHL IP PHARYNGEAL PHASE 01/10/2015 Pharyngeal Phase Impaired Pharyngeal - Pudding Teaspoon (None) Penetration/Aspiration details (pudding teaspoon) (None) Pharyngeal - Pudding Cup (None) Penetration/Aspiration details (pudding cup) (None) Pharyngeal - Honey Teaspoon (None) Penetration/Aspiration details (honey teaspoon) (None) Pharyngeal - Honey Cup (None)  Penetration/Aspiration details (honey cup) (None) Pharyngeal - Honey Syringe (None) Penetration/Aspiration details (honey syringe) (None) Pharyngeal - Nectar Teaspoon (None)  Penetration/Aspiration details (nectar teaspoon) (None) Pharyngeal - Nectar Cup (None) Penetration/Aspiration details (nectar cup) (None) Pharyngeal - Nectar Straw (None) Penetration/Aspiration details (nectar straw) (None) Pharyngeal - Nectar Syringe (None) Penetration/Aspiration details (nectar syringe) (None) Pharyngeal - Ice Chips (None) Penetration/Aspiration details (ice chips) (None) Pharyngeal - Thin Teaspoon (None) Penetration/Aspiration details (thin teaspoon) (None) Pharyngeal - Thin Cup (None) Penetration/Aspiration details (thin cup) (None) Pharyngeal - Thin Straw (None) Penetration/Aspiration details (thin straw) (None) Pharyngeal - Thin Syringe (None) Penetration/Aspiration details (thin syringe') (None) Pharyngeal - Puree (None) Penetration/Aspiration details (puree) (None) Pharyngeal - Mechanical Soft (None) Penetration/Aspiration details (mechanical soft) (None) Pharyngeal - Regular (None) Penetration/Aspiration details (regular) (None) Pharyngeal - Multi-consistency (None) Penetration/Aspiration details (multi-consistency) (None) Pharyngeal - Pill (None) Penetration/Aspiration details (pill) (None) Pharyngeal Comment (None) Jerome Bowen 01/10/2015, 3:26 PM Dimas Aguas, MA, CCC-SLP Acute Rehab SLP [phone]]     Assessment/Plan: Diagnosis: cardio-embolic cva 1. Does the need for close, 24 hr/day medical supervision in concert with the patient's rehab needs make it unreasonable for this patient to be served in a less intensive setting? Yes 2. Co-Morbidities requiring supervision/potential complications: cad, esophageal stricture 3. Due to bladder management, bowel management, safety, skin/wound care, disease management, medication administration, pain management and  patient education, does the patient require 24 hr/day rehab nursing? Yes 4. Does the patient require coordinated care of a physician, rehab nurse, PT (1-2 hrs/day, 5 days/week), OT (1-2 hrs/day, 5 days/week) and SLP (1-2 hrs/day, 5 days/week) to address physical and functional deficits in the context of the above medical diagnosis(es)? Yes Addressing deficits in the following areas: balance, endurance, locomotion, strength, transferring, bowel/bladder control, bathing, dressing, feeding, grooming, toileting, speech, swallowing and psychosocial support 5. Can the patient actively participate in an intensive therapy program of at least 3 hrs of therapy per day at least 5 days per week? Yes 6. The potential for patient to make measurable gains while on inpatient rehab is excellent 7. Anticipated functional outcomes upon discharge from inpatient rehab are modified independent and supervision with PT, modified independent and supervision with OT, supervision and min assist with SLP. 8. Estimated rehab length of stay to reach the above functional goals is: 10-16 days 9. Does the patient have adequate social supports and living environment to accommodate these discharge functional goals? Yes 10. Anticipated D/C setting: Home 11. Anticipated post D/C treatments: HH therapy and Outpatient therapy 12. Overall Rehab/Functional Prognosis: excellent  RECOMMENDATIONS: This patient's condition is appropriate for continued rehabilitative care in the following setting: CIR Patient has agreed to participate in recommended program. Yes Note that insurance prior authorization may be required for reimbursement for recommended care.  Comment: Rehab Admissions Coordinator to follow up.  Thanks,  Ranelle Oyster, MD, [place]     01/12/2015       Revision History     Date/Time User Provider Type Action   01/12/2015 4:24 PM Ranelle Oyster, MD Physician Sign   01/12/2015 12:03 PM Charlton Amor, PA-C  Physician Assistant Pend   View Details Report       Routing History     Date/Time From To Method   01/12/2015 4:24 PM Ranelle Oyster, MD Ranelle Oyster, MD In Basket   01/12/2015 4:24 PM Ranelle Oyster, MD No Pcp Per Patient In Basket

## 2015-01-15 NOTE — Progress Notes (Signed)
Patient became orthostatic this morning all in the bathroom with therapies while attempting a bowel movement. Suspect vagal response. Blood pressure 66/48. Order for IV fluids as well as EKG. Patient now responding nicely. Spoke at length with wife. Also plan for venous Doppler studies due to some swelling of the right lower extremity.

## 2015-01-15 NOTE — Consult Note (Signed)
Reason for Consult:   Hypotension  Requesting Physician: Rehab Primary Cardiologist [place]  HPI: This is a 81 y.o. male with a past medical history significant for CAD. He was at [facility] 01/08/15 and had an RCA DES after an abnormal Myoview. Apparently everything went well until immediately after the procedure when he suffered a stroke. He was transferred to [facility] for rehab. He was progressing in rehab till today when he became orthostatic after getting up to the bathroom. He was given an IVF bolus and responded with a B/P in the 90s. This evening he was being helped to the bedside commode when he again became syncope and hypotensive. When I arrived tonight he was gray, diaphoretic, responsive but lethargic. Rapid response was already in the room. He denied chest pain. EKG did not suggest acute MI. CCM called and will take the pt to CCU.   PMHx:  Past Medical History  Diagnosis Date  . Essential hypertension   . Dyslipidemia   . CAD (coronary artery disease)   . S/P right coronary artery (RCA) stent placement   . OSA (obstructive sleep apnea)   . Esophageal stricture   . Seronegative arthritis   . Fatty liver   . Peripheral vascular disease     Past Surgical History  Procedure Laterality Date  . Cardiac catheterization    . Colonoscopy      SOCHx:  reports that he has quit smoking. His smoking use included Cigarettes. He has quit using smokeless tobacco. He reports that he drinks alcohol. He reports that he does not use illicit drugs.  FAMHx: Family History  Problem Relation Age of Onset  . Heart disease Brother     ALLERGIES: No Known Allergies  ROS: Review of systems not obtained due to patient factors.  HOME MEDICATIONS: Prior to Admission medications   Medication Sig Start Date End Date Taking? Authorizing Provider  aspirin EC 325 MG tablet Take 325 mg by mouth daily.    Historical Provider, MD  atorvastatin (LIPITOR) 10 MG tablet Place 1 tablet (10  mg total) into feeding tube daily at 6 PM. 01/14/15   Maryann Mikhail, DO  cholecalciferol (VITAMIN D) 1000 UNITS tablet Take 1,000 Units by mouth daily.    Historical Provider, MD  clopidogrel (PLAVIX) 75 MG tablet Take 75 mg by mouth daily.    Historical Provider, MD  Cyanocobalamin 1000 MCG TBCR Take 1 tablet by mouth daily.    Historical Provider, MD  diphenhydrAMINE-zinc acetate (BENADRYL) cream Apply topically 3 (three) times daily as needed for itching. 01/14/15   Maryann Mikhail, DO  docusate sodium (COLACE) 100 MG capsule Take 1 capsule (100 mg total) by mouth 2 (two) times daily. 01/14/15   Maryann Mikhail, DO  famotidine (PEPCID) 20 MG tablet Take 1 tablet (20 mg total) by mouth 2 (two) times daily. 01/14/15   Maryann Mikhail, DO  losartan (COZAAR) 100 MG tablet Take 100 mg by mouth daily.    Historical Provider, MD  Maltodextrin-Xanthan Gum (RESOURCE THICKENUP CLEAR) POWD Use as needed. 01/14/15   Maryann Mikhail, DO  senna (SENOKOT) 8.6 MG TABS tablet Take 1 tablet (8.6 mg total) by mouth 2 (two) times daily. 01/14/15   Maryann Mikhail, DO  sodium chloride (OCEAN) 0.65 % SOLN nasal spray Place 1 spray into both nostrils as needed for congestion. 01/14/15   Maryann Mikhail, DO  tamsulosin (FLOMAX) 0.4 MG CAPS capsule Take 0.4 mg by mouth daily.     Historical  Provider, MD  vitamin E 1000 UNIT capsule Take 1,000 Units by mouth daily.    Historical Provider, MD    HOSPITAL MEDICATIONS: I have reviewed the patient's current medications.  VITALS: Blood pressure 64/48, pulse 122, temperature 97.8 F (36.6 C), temperature source Oral, resp. rate 22, weight 177 lb (80.287 kg), SpO2 92 %.  PHYSICAL EXAM: General appearance: cooperative, no distress, pale and diaphoretic Neck: no JVD Lungs: clear to auscultation bilaterally Heart: regular rate and rhythm and decreased heart sounds Abdomen: not distended Extremities: no edema Pulses: diminnished Skin: pale, cool, diaphoretic Neurologic: Grossly  normal  LABS: Results for orders placed or performed during the hospital encounter of 01/14/15 (from the past 24 hour(s))  CBC     Status: Abnormal   Collection Time: 01/14/15  9:03 PM  Result Value Ref Range   WBC 6.7 4.0 - 10.5 K/uL   RBC 3.54 (L) 4.22 - 5.81 MIL/uL   Hemoglobin 11.4 (L) 13.0 - 17.0 g/dL   HCT 16.133.1 (L) 09.639.0 - 04.552.0 %   MCV 93.5 78.0 - 100.0 fL   MCH 32.2 26.0 - 34.0 pg   MCHC 34.4 30.0 - 36.0 g/dL   RDW 40.912.9 81.111.5 - 91.415.5 %   Platelets 231 150 - 400 K/uL  Creatinine, serum     Status: None   Collection Time: 01/14/15  9:03 PM  Result Value Ref Range   Creatinine, Ser 0.85 0.61 - 1.24 mg/dL   GFR calc non Af Amer >60 >60 mL/min   GFR calc Af Amer >60 >60 mL/min  CBC WITH DIFFERENTIAL     Status: Abnormal   Collection Time: 09-25-14  6:14 AM  Result Value Ref Range   WBC 6.3 4.0 - 10.5 K/uL   RBC 3.66 (L) 4.22 - 5.81 MIL/uL   Hemoglobin 11.7 (L) 13.0 - 17.0 g/dL   HCT 78.233.8 (L) 95.639.0 - 21.352.0 %   MCV 92.3 78.0 - 100.0 fL   MCH 32.0 26.0 - 34.0 pg   MCHC 34.6 30.0 - 36.0 g/dL   RDW 08.612.9 57.811.5 - 46.915.5 %   Platelets 234 150 - 400 K/uL   Neutrophils Relative % 66 43 - 77 %   Neutro Abs 4.2 1.7 - 7.7 K/uL   Lymphocytes Relative 17 12 - 46 %   Lymphs Abs 1.1 0.7 - 4.0 K/uL   Monocytes Relative 12 3 - 12 %   Monocytes Absolute 0.8 0.1 - 1.0 K/uL   Eosinophils Relative 4 0 - 5 %   Eosinophils Absolute 0.2 0.0 - 0.7 K/uL   Basophils Relative 1 0 - 1 %   Basophils Absolute 0.1 0.0 - 0.1 K/uL  Comprehensive metabolic panel     Status: Abnormal   Collection Time: 09-25-14  6:14 AM  Result Value Ref Range   Sodium 141 135 - 145 mmol/L   Potassium 3.8 3.5 - 5.1 mmol/L   Chloride 108 101 - 111 mmol/L   CO2 25 22 - 32 mmol/L   Glucose, Bld 102 (H) 70 - 99 mg/dL   BUN 21 (H) 6 - 20 mg/dL   Creatinine, Ser 6.290.88 0.61 - 1.24 mg/dL   Calcium 8.7 (L) 8.9 - 10.3 mg/dL   Total Protein 5.7 (L) 6.5 - 8.1 g/dL   Albumin 2.7 (L) 3.5 - 5.0 g/dL   AST 18 15 - 41 U/L   ALT 16 (L)  17 - 63 U/L   Alkaline Phosphatase 52 38 - 126 U/L   Total Bilirubin 0.8 0.3 -  1.2 mg/dL   GFR calc non Af Amer >60 >60 mL/min   GFR calc Af Amer >60 >60 mL/min   Anion gap 8 5 - 15  Glucose, capillary     Status: Abnormal   Collection Time: 31-Jan-2015  6:53 PM  Result Value Ref Range   Glucose-Capillary 162 (H) 70 - 99 mg/dL    EKG: NST, ST  IMAGING: No results found.  IMPRESSION: Principal Problem:   Hypotension Active Problems:   CVA- after RCA PCI/ DES 01/09/15   S/P right coronary artery (RCA) stent placement   DVT- peroneal, not Heparinized   Essential hypertension   Dyslipidemia   OSA (obstructive sleep apnea)   Dysphagia following cerebrovascular accident   RECOMMENDATION: MD to see, check echo. Does not look like an MI- consider PE, dehydration, ? CVA extension.  Time Spent Directly with Patient: 40 minutes  Jerome Bowen [phone] beeper 01/31/15, 7:26 PM   Personally seen and examined. Agree with above. Post cath, CVA, DES-RCA on Plavix and ASA  Ashen appearance, hypotensive, dyspnea on face mask O2, tachycardia, no acute ECG changes.  Small right groin hematoma with associated ecchymosis. He does not endorse back pain or chest pain.    Bedside echo - RV dilated with decreased contractile performance, IVC does not appear to have respiratory variation (elevated central venous pressure). No pericardial effusion. Normal, hyperdynamic left ventricular contraction (EF 65%-70%).  With this constellation of findings along with DVT noted on LE Doppler, I would treat for presumed PE until proven otherwise.   IV heparin.   Currently appears critically ill in shock. Requires norepinephrine.   Discussed with nursing, as well as CCM provider Dr. Vassie Loll. Discussed with family outside of room. Understands that he is being transported to [facility] unit/ critical care.   Critical care time 40 min (patient examination, discussion, performance of bedside ultrasound, review of  extensive medical history and complex medical decision making).

## 2015-01-15 NOTE — Progress Notes (Addendum)
ANTICOAGULATION CONSULT NOTE - Initial Consult  Pharmacy Consult for heparin Indication: r/o PE  No Known Allergies  Patient Measurements: Wt= 80.3kg Ht= 5' 8'' IBW: 68.4kg  Vital Signs: Temp: 97.8 F (36.6 C) (05/05 1342) Temp Source: Oral (05/05 1842) BP: 64/48 mmHg (05/05 1842) Pulse Rate: 122 (05/05 1842)  Labs:  Recent Labs  01/14/15 2103 11/01/14 0614 11/01/14 1916  HGB 11.4* 11.7* 12.7*  HCT 33.1* 33.8* 36.8*  PLT 231 234 348  CREATININE 0.85 0.88  --     Estimated Creatinine Clearance: 65.9 mL/min (by C-G formula based on Cr of 0.88).   Medical History: Past Medical History  Diagnosis Date  . Essential hypertension   . Dyslipidemia   . CAD (coronary artery disease)   . S/P right coronary artery (RCA) stent placement   . OSA (obstructive sleep apnea)   . Esophageal stricture   . Seronegative arthritis   . Fatty liver   . Peripheral vascular disease     Medications:  Prescriptions prior to admission  Medication Sig Dispense Refill Last Dose  . aspirin EC 325 MG tablet Take 325 mg by mouth daily.   Unk  . atorvastatin (LIPITOR) 10 MG tablet Place 1 tablet (10 mg total) into feeding tube daily at 6 PM. 30 tablet 0   . cholecalciferol (VITAMIN D) 1000 UNITS tablet Take 1,000 Units by mouth daily.   Past Week at Unknown time  . clopidogrel (PLAVIX) 75 MG tablet Take 75 mg by mouth daily.     . Cyanocobalamin 1000 MCG TBCR Take 1 tablet by mouth daily.   Past Week at Unknown time  . diphenhydrAMINE-zinc acetate (BENADRYL) cream Apply topically 3 (three) times daily as needed for itching. 28.4 g 0   . docusate sodium (COLACE) 100 MG capsule Take 1 capsule (100 mg total) by mouth 2 (two) times daily. 10 capsule 0   . famotidine (PEPCID) 20 MG tablet Take 1 tablet (20 mg total) by mouth 2 (two) times daily.     Marland Kitchen. losartan (COZAAR) 100 MG tablet Take 100 mg by mouth daily.   01/08/2015 at Unknown time  . Maltodextrin-Xanthan Gum (RESOURCE THICKENUP CLEAR) POWD  Use as needed.     . senna (SENOKOT) 8.6 MG TABS tablet Take 1 tablet (8.6 mg total) by mouth 2 (two) times daily. 120 each 0   . sodium chloride (OCEAN) 0.65 % SOLN nasal spray Place 1 spray into both nostrils as needed for congestion.  0   . tamsulosin (FLOMAX) 0.4 MG CAPS capsule Take 0.4 mg by mouth daily.    Past Week at Unknown time  . vitamin E 1000 UNIT capsule Take 1,000 Units by mouth daily.   Past Week at Unknown time   Scheduled:  . pantoprazole (PROTONIX) IV  40 mg Intravenous QHS  . sodium chloride  2,000 mL Intravenous Once    Assessment: 81 yo male with venous duplex showing appearance of mid right peroneal vein  DVT and also with suspicion of PE to start heparin per pharmacy.  Hg/hct= 12.7/36.8, plt= 348. Patient is also noted with CVA (MRI 4/29 noted with multifocal infarcts)  Goal of Therapy:  Heparin level= 0.3-0.5; low goal due to CVA Monitor platelets by anticoagulation protocol: Yes   Plan:  -No heparin bolus due to CVA -Begin heparin infusion at 1100 units/hr (~ 14 units/kg/hr) -Heparin level in 8 hours and daily wth CBC daily  Harland Germanndrew Donathan Buller, Pharm D 01/28/2015 8:27 PM

## 2015-01-15 NOTE — Progress Notes (Signed)
Social Work Assessment and Plan Social Work Assessment and Plan  Patient Details  Name: Jerome Bowen MRN: [id number] Date of Birth: 07/04/1934  Today's Date: 01/14/2015  Problem List:  Patient Active Problem List   Diagnosis Date Noted  . CVA (cerebral infarction) 01/14/2015  . Coronary artery disease involving native coronary artery of native heart without angina pectoris   . Dysphagia following cerebrovascular accident   . Essential hypertension   . Dyslipidemia   . CAD (coronary artery disease)   . S/P right coronary artery (RCA) stent placement   . OSA (obstructive sleep apnea)   . Esophageal stricture   . Encounter for nasogastric (NG) tube placement   . Cardioembolic stroke, old, with hemiparesis 01/09/2015   Past Medical History:  Past Medical History  Diagnosis Date  . Essential hypertension   . Dyslipidemia   . CAD (coronary artery disease)   . S/P right coronary artery (RCA) stent placement   . OSA (obstructive sleep apnea)   . Esophageal stricture   . Seronegative arthritis   . Fatty liver   . Peripheral vascular disease    Past Surgical History:  Past Surgical History  Procedure Laterality Date  . Cardiac catheterization    . Colonoscopy     Social History:  reports that he has quit smoking. His smoking use included Cigarettes. He has quit using smokeless tobacco. He reports that he drinks alcohol. He reports that he does not use illicit drugs.  Family / Support Systems Marital Status: Married Patient Roles: Spouse, Parent Spouse/Significant Other: [name] [phone] Children: Nadine Countsrya Errichiello-daughter  [phone] Other Supports: Charity fundraiserTina Moser-daughter [phone] Anticipated Caregiver: Wife and daughter's Ability/Limitations of Caregiver: Wife is in good health and here staying with pt Caregiver Availability: 24/7 Family Dynamics: Close knit family both daughter's are local and are here daily.  Wife stays or daughter's stay with pt, feel better if  someone is here with him.  Wife reports he is so independent and it is difficult for him to rely upon others.  Social History Preferred language: English Religion: Baptist Cultural Background: No issues Education: Automotive engineerCollege Read: Yes Write: Yes Employment Status: Retired Fish farm managerLegal Hisotry/Current Legal Issues: No issues Guardian/Conservator: none-according to MD pt is capable of making his own decisions while here.   Abuse/Neglect Physical Abuse: Denies Verbal Abuse: Denies Sexual Abuse: Denies Exploitation of patient/patient's resources: Denies Self-Neglect: Denies  Emotional Status Pt's affect, behavior adn adjustment status: Pt is motivated but had an episode this am and is in bed now.  It scared his daughter and wife this am.  Both report how motivated and independent he is and always helping others. Both feel it will take time to build up his strength and participate fully in the rehab program. Recent Psychosocial Issues: Was healthy prior to admission before cardiac cath Pyschiatric History: No history deferred depression due to episode and resting now. Will monitor through therapies and see if neuro-psych would be beneficial  during his stay Substance Abuse History: No issues  Patient / Family Perceptions, Expectations & Goals Pt/Family understanding of illness & functional limitations: Pt and wife and daughter can explain his stroke and deficits.  They speak with MD daily while rounding and feel their questions and concerns are being addressed.  They are not ones to not ask if they have one, strong advocates for husband and Dad. Premorbid pt/family roles/activities: Husband, father, grandfather, retiree, home owner, church member, etc Anticipated changes in roles/activities/participation: resume Pt/family expectations/goals: Pt states: " I so tired, need to  rest."  Wife states: " I hope he will be moving and able to do for himself."  Daugther states: " I will help but hope he regains  his function."  [employer] IncCommunity Resources Community Agencies: None Premorbid Home Care/DME Agencies: None Transportation available at discharge: [employer] Resource referrals recommended: Neuropsychology, Support group (specify)  Discharge Planning Living Arrangements: Spouse/significant other Support Systems: Spouse/significant other, Children, Other relatives, Friends/neighbors, Psychologist, clinicalChurch/faith community Type of Residence: Private residence Insurance Resources: Media plannerrivate Insurance (specify) Investment banker, operational(UHC-Medicare) Financial Resources: Restaurant manager, fast foodocial Security Financial Screen Referred: No Living Expenses: Lives with family Money Management: Spouse, Patient Does the patient have any problems obtaining your medications?: No Home Management: Both do home management Patient/Family Preliminary Plans: Return home with wife who can assist she is in good health.  Both daughter's will be involved also.  Will await team's evaluations and come up with a safe discharge plan. Evaluations may take another day due to episode pt had in therapies. All very committed to to pt. Social Work Anticipated Follow Up Needs: HH/OP, Support Group  Clinical Impression Pleasant family unable to truly assess pt due to recent episode and sleeping in bed. Wife and daughter's are very involved and committed to pt.  All will be assisting in his care, someone is always  Here with him. Will await evlaution's and develop a discharge plan.  Provide support to wife she looks tired already from staying here with husband.  Lucy Chrisupree, Liban Guedes G 01/12/2015, 1:00 PM

## 2015-01-15 NOTE — Progress Notes (Signed)
*  PRELIMINARY RESULTS* Vascular Ultrasound Lower extremity venous duplex has been completed.  Preliminary findings: A small area of the mid right peroneal veins appear to have DVT. No DVT noted in LLE.    Farrel DemarkJill Eunice, RDMS, RVT  02/01/2015, 2:51 PM

## 2015-01-15 NOTE — Progress Notes (Signed)
81 y.o. right handed male with history of hypertension, hyperlipidemia and angina. Patient underwent elective cardiac cath for chest pain with abnormal stress test findings of distal RCA stenosis underwent stenting 01/08/2015 at [facility] Patient independent prior to admission living with his wife in [place]. Following the procedure noted to be agitated as well as difficulty speaking. MRI obtained which showed multifocal infarcts in both anterior and posterior circulation consistent with cardiac embolus transferred to [facility]. Patient also had developed postoperative hematoma after cardiac catheterization. Did not receive TPA. Echocardiogram with ejection fraction of 71% systolic function was vigorous. MRI of the brain without stenosis. Neurology consulted presently maintained on aspirin and Plavix therapy. Subcutaneous heparin for DVT prophylaxis. Patient is nothing by mouth with nasogastric tube feeds after a failed modified barium swallow with diet advanced to a dysphagia #1 nectar liquids 01/13/2015 and nasogastric tube removed Subjective/Complaints: Double vision using eye patch Drinks thickened liquids Some R leg swelling no pain Objective: Vital Signs: Blood pressure 149/78, pulse 84, temperature 98.7 F (37.1 C), temperature source Oral, resp. rate 18, weight 80.287 kg (177 lb), SpO2 96 %. Dg Swallowing Func-speech Pathology  01/13/2015    Objective Swallowing Evaluation:    Patient Details  Name: Jerome Bowen MRN: [id number] Date of Birth: 1933-11-20  Today's Date: 01/13/2015 Time: SLP Start Time (ACUTE ONLY): 1142-SLP Stop Time (ACUTE ONLY): 1206 SLP Time Calculation (min) (ACUTE ONLY): 24 min  Past Medical History:  Past Medical History  Diagnosis Date  . Essential hypertension   . Dyslipidemia   . CAD (coronary artery disease)   . S/P right coronary artery (RCA) stent placement   . OSA (obstructive sleep apnea)   . Esophageal stricture   .  Seronegative arthritis   . Fatty liver    Past Surgical History:  Past Surgical History  Procedure Laterality Date  . Cardiac catheterization    . Colonoscopy     HPI: Jerome Bowen is a 81 y.o. male with past medical history of  hypertension, dyslipidemia, coronary artery disease, obstructive sleep  apnea on C Pap, seronegative arthritis. The patient was brought in from  [facility].   Assessment / Plan / Recommendation CHL IP CLINICAL IMPRESSIONS 01/13/2015  Therapy Diagnosis Moderate oral phase dysphagia;Moderate pharyngeal phase  dysphagia   Clinical Impression Pt shows increased strength and ROM with oral  structures, which increases his ability to orally control and manipulate  POs. A moderate oropharyngeal dyspphagia persists with impaired  mastication and slow oral transit leading to premature spillage with all  consistencies. Thin liquids are silently penetrated before the swallow,  requiring cued cough to clear. Postural maneuvers and bolus manipulation  did not prevent penetration, but intervention for nectar thick liquids  adequately increased airway protection. Mild-moderate residuals remaind in  the vallecular space post-swallow, but were sufficiently reduced with  reflexive second swallows. Recommend to initiate Dys 1 diet and nectar  thick liquids. With adequate tolerance of this diet, pt may be appropriate  for initiation of water protocol and/or ice chips with SLP.      CHL IP TREATMENT RECOMMENDATION 01/13/2015  Treatment Recommendations Therapy as outlined in treatment plan below     CHL IP DIET RECOMMENDATION 01/13/2015  SLP Diet Recommendations Dysphagia 1 (Puree);Nectar  Liquid Administration via (None)  Medication Administration Crushed with puree  Compensations Slow rate;Small sips/bites;Check for anterior loss;Check for  pocketing  Postural Changes and/or Swallow Maneuvers (None)     CHL IP OTHER RECOMMENDATIONS  01/13/2015  Recommended Consults (None)  Oral Care Recommendations Oral care BID   Other Recommendations (None)     CHL IP FOLLOW UP RECOMMENDATIONS 01/13/2015  Follow up Recommendations Inpatient Rehab     CHL IP FREQUENCY AND DURATION 01/13/2015  Speech Therapy Frequency (ACUTE ONLY) (None)  Treatment Duration 2 weeks     Pertinent Vitals/Pain: n/a     SLP Swallow Goals No flowsheet data found.  No flowsheet data found.    CHL IP REASON FOR REFERRAL 01/13/2015  Reason for Referral Objectively evaluate swallowing function     CHL IP ORAL PHASE 01/13/2015  Oral Phase Impaired       CHL IP PHARYNGEAL PHASE 01/13/2015  Pharyngeal Phase Impaired        CHL IP CERVICAL ESOPHAGEAL PHASE 01/13/2015  Cervical Esophageal Phase [facility]         Germain Osgood, M.A. CCC-SLP [phone]  Germain Osgood 01/13/2015, 1:33 PM    Results for orders placed or performed during the hospital encounter of 01/14/15 (from the past 72 hour(s))  CBC     Status: Abnormal   Collection Time: 01/14/15  9:03 PM  Result Value Ref Range   WBC 6.7 4.0 - 10.5 K/uL   RBC 3.54 (L) 4.22 - 5.81 MIL/uL   Hemoglobin 11.4 (L) 13.0 - 17.0 g/dL   HCT 33.1 (L) 39.0 - 52.0 %   MCV 93.5 78.0 - 100.0 fL   MCH 32.2 26.0 - 34.0 pg   MCHC 34.4 30.0 - 36.0 g/dL   RDW 12.9 11.5 - 15.5 %   Platelets 231 150 - 400 K/uL  Creatinine, serum     Status: None   Collection Time: 01/14/15  9:03 PM  Result Value Ref Range   Creatinine, Ser 0.85 0.61 - 1.24 mg/dL   GFR calc non Af Amer >60 >60 mL/min   GFR calc Af Amer >60 >60 mL/min    Comment: (NOTE) The eGFR has been calculated using the CKD EPI equation. This calculation has not been validated in all clinical situations. eGFR's persistently <90 mL/min signify possible Chronic Kidney Disease.   CBC WITH DIFFERENTIAL     Status: Abnormal   Collection Time: 01/19/2015  6:14 AM  Result Value Ref Range   WBC 6.3 4.0 - 10.5 K/uL   RBC 3.66 (L) 4.22 - 5.81 MIL/uL   Hemoglobin 11.7 (L) 13.0 - 17.0 g/dL   HCT 33.8 (L) 39.0 - 52.0 %   MCV 92.3 78.0 - 100.0 fL   MCH 32.0 26.0 - 34.0 pg   MCHC  34.6 30.0 - 36.0 g/dL   RDW 12.9 11.5 - 15.5 %   Platelets 234 150 - 400 K/uL   Neutrophils Relative % 66 43 - 77 %   Neutro Abs 4.2 1.7 - 7.7 K/uL   Lymphocytes Relative 17 12 - 46 %   Lymphs Abs 1.1 0.7 - 4.0 K/uL   Monocytes Relative 12 3 - 12 %   Monocytes Absolute 0.8 0.1 - 1.0 K/uL   Eosinophils Relative 4 0 - 5 %   Eosinophils Absolute 0.2 0.0 - 0.7 K/uL   Basophils Relative 1 0 - 1 %   Basophils Absolute 0.1 0.0 - 0.1 K/uL     HEENT: diplopia with lateral gaze, facial diplegia Cardio: RRR and no murmur Resp: CTA B/L and unlabored GI: BS positive and NT, ND Extremity:  Pulses positive and No Edema Skin:   Intact Neuro: Alert/Oriented, Flat, Cranial Nerve Abnormalities left CN6, INO, Normal Sensory,  Abnormal Motor 4/5 in RUE  3- RLE, 4+ LUE and LLE, Abnormal FMC Ataxic/ dec FMC and Dysarthric Musc/Skel:  Swelling RIght calf Gen NAD   Assessment/Plan: 1. Functional deficits secondary to embolic bicerebral strokes with right HP, dysarthria, cranial nerve deficits, dysarthria and dysphagia which require 3+ hours per day of interdisciplinary therapy in a comprehensive inpatient rehab setting. Physiatrist is providing close team supervision and 24 hour management of active medical problems listed below. Physiatrist and rehab team continue to assess barriers to discharge/monitor patient progress toward functional and medical goals. FIM:                                  Medical Problem List and Plan: 1. Functional deficits secondary to cardioembolic bi-cerebral CVA after cardiac catheterization with stenting 2. DVT Prophylaxis/Anticoagulation: Subcutaneous heparin. Monitor platelet count 90 signs of bleeding 3. Pain Management: Tylenol as needed 4. Dysphagia. dysphagia 1 nectar liquids. Follow-up speech therapy 5. Neuropsych: This patient is capable of making decisions on his own behalf. 6. Skin/Wound Care: Routine skin checks 7.  Fluids/Electrolytes/Nutrition: Strict I and O's with follow-up chemistries 8. Hyperlipidemia. Lipitor 9. Hypertension. Currently on no antihypertensive medication. Patient on Cozaar 100 mg daily prior to admission. Monitor with increased mobility  LOS (Days) 1 A FACE TO FACE EVALUATION WAS PERFORMED  Alisea Matte E 02/05/2015, 7:29 AM

## 2015-01-15 NOTE — Procedures (Signed)
Central Venous Catheter Insertion Procedure Note Tressia DanasJack L Fingerhut [id number] August 01, 1934  Procedure: Insertion of Central Venous Catheter Indications: Drug and/or fluid administration  Procedure Details Consent: Risks of procedure as well as the alternatives and risks of each were explained to the (patient/caregiver).  Consent for procedure obtained. Time Out: Verified patient identification, verified procedure, site/side was marked, verified correct patient position, special equipment/implants available, medications/allergies/relevent history reviewed, required imaging and test results available.  Performed  Maximum sterile technique was used including antiseptics, cap, gloves, gown, hand hygiene, mask and sheet. Skin prep: Chlorhexidine; local anesthetic administered A antimicrobial bonded/coated triple lumen catheter was placed in the left internal jugular vein using the Seldinger technique.  Evaluation Blood flow good Complications: No apparent complications Patient did tolerate procedure well. Chest X-ray ordered to verify placement.  CXR: normal. Good position   Performed by Mikey BussingHoffman NP, supervised by me Oretha MilchALVA,RAKESH V. 01/26/2015, 8:23 PM

## 2015-01-15 NOTE — Care Management Note (Signed)
Inpatient Rehabilitation Center Individual Statement of Services  Patient Name:  Jerome Bowen  Date:  02/04/2015  Welcome to the Inpatient Rehabilitation Center.  Our goal is to provide you with an individualized program based on your diagnosis and situation, designed to meet your specific needs.  With this comprehensive rehabilitation program, you will be expected to participate in at least 3 hours of rehabilitation therapies Monday-Friday, with modified therapy programming on the weekends.  Your rehabilitation program will include the following services:  Physical Therapy (PT), Occupational Therapy (OT), Speech Therapy (ST), 24 hour per day rehabilitation nursing, Therapeutic Recreaction (TR), Case Management (Social Worker), Rehabilitation Medicine, Nutrition Services and Pharmacy Services  Weekly team conferences will be held on Wednesday to discuss your progress.  Your Social Worker will talk with you frequently to get your input and to update you on team discussions.  Team conferences with you and your family in attendance may also be held.  Expected length of stay: 2-3 weeks  Overall anticipated outcome: supervision/min level  Depending on your progress and recovery, your program may change. Your Social Worker will coordinate services and will keep you informed of any changes. Your Social Worker's name and contact numbers are listed  below.  The following services may also be recommended but are not provided by the Inpatient Rehabilitation Center:   Driving Evaluations  Home Health Rehabiltiation Services  Outpatient Rehabilitation Services    Arrangements will be made to provide these services after discharge if needed.  Arrangements include referral to agencies that provide these services.  Your insurance has been verified to be:  UHC-Medicare Your primary doctor is:    Pertinent information will be shared with your doctor and your insurance company.  Social Worker:  Dossie DerBecky  Esbeydi Manago, SW [phone] or [phone]) [phone]  Information discussed with and copy given to patient by: Lucy Chrisupree, Feather Berrie G, 02/09/2015, 11:59 AM

## 2015-01-15 NOTE — Procedures (Signed)
Arterial Catheter Insertion Procedure Note Jerome Bowen [id number] 12-Oct-1933  Procedure: Insertion of Arterial Catheter  Indications: Blood pressure monitoring  Procedure Details Consent: Unable to obtain consent because of emergent medical necessity. Time Out: Verified patient identification, verified procedure, site/side was marked, verified correct patient position, special equipment/implants available, medications/allergies/relevent history reviewed, required imaging and test results available.  Performed  Maximum sterile technique was used including antiseptics, cap, gloves, gown, hand hygiene, mask and sheet. Skin prep: Chlorhexidine; local anesthetic administered 20 gauge catheter was inserted into right radial artery using the Seldinger technique.  Evaluation Blood flow good; BP tracing good. Complications: No apparent complications.   Jerome Bowen, Jerome Bowen 02/09/2015

## 2015-01-15 NOTE — Progress Notes (Signed)
Venous Doppler studies revealed a small area of mid right peroneal vein DVT. At this time plan to continue to monitor follow-up Doppler study. Continue aspirin and Plavix as well as subcutaneous heparin. All issues discussed with family

## 2015-01-15 NOTE — Progress Notes (Signed)
Patient found on toilet unresponsive, moved to bed and blood pressure checked, patient was hypotensive and had desatted to the upper 80's. Link Snuffer. Ang[place], PA notified.  Per his instructions writer ran a bolus of .45% NS to increase vascular volume. Patient's blood pressure responded by increasing until the systolic pressure was in the 90's and diastolic in the 60's. Patient was placed on O2 at 2L via nasal cannula. His o2 sats climbed to the mid to high 90's. He became more alert and his general color returned to normal.

## 2015-01-15 NOTE — Evaluation (Signed)
Physical Therapy Assessment and Plan  Patient Details  Name: Jerome Bowen MRN: [id number] Date of Birth: 11-06-1933  PT Diagnosis: Abnormal posture, Abnormality of gait, Ataxia, Coordination disorder, Difficulty walking, Hemiparesis dominant and Impaired sensation Rehab Potential: Good ELOS: 19-21 days   Today's Date: 01/17/2015 PT Individual Time: 6295-2841 PT Individual Time Calculation (min): 45 min    Problem List:  Patient Active Problem List   Diagnosis Date Noted  . CVA (cerebral infarction) 01/14/2015  . Coronary artery disease involving native coronary artery of native heart without angina pectoris   . Dysphagia following cerebrovascular accident   . Essential hypertension   . Dyslipidemia   . CAD (coronary artery disease)   . S/P right coronary artery (RCA) stent placement   . OSA (obstructive sleep apnea)   . Esophageal stricture   . Encounter for nasogastric (NG) tube placement   . Cardioembolic stroke, old, with hemiparesis 01/09/2015    Past Medical History:  Past Medical History  Diagnosis Date  . Essential hypertension   . Dyslipidemia   . CAD (coronary artery disease)   . S/P right coronary artery (RCA) stent placement   . OSA (obstructive sleep apnea)   . Esophageal stricture   . Seronegative arthritis   . Fatty liver   . Peripheral vascular disease    Past Surgical History:  Past Surgical History  Procedure Laterality Date  . Cardiac catheterization    . Colonoscopy      Assessment & Plan Clinical Impression: Patient is a 81 y.o. year old male with history of hypertension, hyperlipidemia and angina. Patient underwent elective cardiac cath for chest pain with abnormal stress test findings of distal RCA stenosis underwent stenting 01/08/2015 at [facility]. Patient independent prior to admission living with his wife in [place]. Following the procedure noted to be agitated as well as difficulty speaking. MRI obtained  which showed multifocal infarcts in both anterior and posterior circulation consistent with cardiac embolus transferred to [facility]. Patient also had developed postoperative hematoma after cardiac catheterization. Did not receive TPA. Echocardiogram with ejection fraction of 32% systolic function was vigorous. MRI of the brain without stenosis. Neurology consulted presently maintained on aspirin and Plavix therapy. Septae separate for DVT prophylaxis. Patient is nothing by mouth with nasogastric tube feeds after a failed modified barium swallow.  Patient transferred to CIR on 01/14/2015 .   Patient currently requires max with mobility secondary to muscle weakness, decreased cardiorespiratoy endurance, ataxia, decreased coordination and decreased motor planning and double vision.  Prior to hospitalization, patient was independent  with mobility and lived with Spouse in a House home.  Home access is 2Stairs to enter.  Patient will benefit from skilled PT intervention to maximize safe functional mobility, minimize fall risk and decrease caregiver burden for planned discharge home with 24 hour supervision.  Anticipate patient will benefit from follow up [place] at discharge.  PT - End of Session Activity Tolerance: Tolerates < 10 min activity with changes in vital signs Endurance Deficit: Yes Endurance Deficit Description: Pt with syncopal/vasovagal response on toilet following attempting bowel movement.  PT Assessment Rehab Potential (ACUTE/IP ONLY): Good PT Patient demonstrates impairments in the following area(s): Balance;Endurance;Motor;Perception;Safety PT Transfers Functional Problem(s): Bed to Chair;Bed Mobility;Car;Furniture PT Locomotion Functional Problem(s): Ambulation;Wheelchair Mobility;Stairs PT Plan PT Intensity: Minimum of 1-2 x/day ,45 to 90 minutes PT Frequency: 5 out of 7 days PT Duration Estimated Length of Stay: 19-21 days PT Treatment/Interventions: Ambulation/gait  training;Balance/vestibular training;Community reintegration;Discharge planning;Disease management/prevention;DME/adaptive  equipment instruction;Functional electrical stimulation;Functional mobility training;Neuromuscular re-education;Pain management;Patient/family education;Stair training;Therapeutic Activities;Therapeutic Exercise;UE/LE Strength taining/ROM;UE/LE Coordination activities;Wheelchair propulsion/positioning;Visual/perceptual remediation/compensation PT Transfers Anticipated Outcome(s): S overall  PT Locomotion Anticipated Outcome(s): S overall at ambulatory level PT Recommendation Follow Up Recommendations: Outpatient PT;Home health PT Patient destination: Home Equipment Recommended: To be determined  Skilled Therapeutic Intervention PT evaluation and assessment completed but very limited, see details below.  Evaluation limited due to vasovagal response while on toilet in restroom.  Pt was able to perform bed mobility at min A level and ambulated to restroom with R HHA at mod A level.  Note ataxic like movements in RLE, however did not get an adequate assessment of mobility during session.  See vitals below.   BP once on toilet was 147/107, once back in bed 54/41, 3 mins later 90/52, then 3 mins later 64/44.  RN and PA in room and assessing pt to state likely vasovagal while on toilet.  RN starting IV fluids and pt left in bed with wife and daughter present.    PT Evaluation Precautions/Restrictions Precautions Precautions: Fall Precaution Comments: pt orthostatic during PT eval, check vitals Restrictions Weight Bearing Restrictions: No General Chart Reviewed: Yes PT Amount of Missed Time (min): 15 Minutes PT Missed Treatment Reason: MD hold (Comment) Family/Caregiver Present: Yes (wife and daughter) Vital Signs Pain Pain Assessment Pain Assessment: No/denies pain Pain Score: 0-No pain Home Living/Prior Functioning Home Living Available Help at Discharge:  Family;Available 24 hours/day Type of Home: House Home Access: Stairs to enter [employer] of Steps: 2 Entrance Stairs-Rails: Left Home Layout: Two level;Able to live on main level with bedroom/bathroom  Lives With: Spouse Prior Function Level of Independence: Independent with basic ADLs;Independent with gait;Independent with transfers  Able to Take Stairs?: Yes Driving: Yes Vocation: Retired Leisure: Hobbies-yes (Comment) Comments: read, soduku, likes to be outside, work on [employer] cabin crew Overall Cognitive Status: Within Functional Limits for tasks assessed Arousal/Alertness: Awake/alert Orientation Level: Oriented X4 Attention: Selective Selective Attention: Appears intact Memory: Impaired Memory Impairment: Decreased recall of new information Awareness: Appears intact Problem Solving: Appears intact Safety/Judgment: Appears intact Sensation Sensation Light Touch: Impaired by gross assessment Stereognosis: Not tested Hot/Cold: Not tested Proprioception: Impaired by gross assessment Coordination Gross Motor Movements are Fluid and Coordinated: No Fine Motor Movements are Fluid and Coordinated: No Coordination and Movement Description: Pt with decreased ability to place LLE during gait.  Motor  Motor Motor: Hemiplegia;Ataxia Motor - Skilled Clinical Observations: Pt with R weakness and ataxia in LLE  Mobility Bed Mobility Bed Mobility: Supine to Sit Supine to Sit: 4: Min assist Supine to Sit Details: Verbal cues for sequencing;Verbal cues for technique;Verbal cues for precautions/safety;Manual facilitation for weight shifting Transfers Transfers: Yes Sit to Stand: 3: Mod assist Sit to Stand Details: Verbal cues for sequencing;Verbal cues for technique;Verbal cues for precautions/safety;Manual facilitation for weight shifting;Manual facilitation for weight bearing Stand to Sit: 3: Mod assist Stand to Sit Details (indicate cue type and reason):  Verbal cues for sequencing;Verbal cues for technique;Verbal cues for precautions/safety;Manual facilitation for weight shifting Stand Pivot Transfers: 3: Mod assist;1: +2 Total assist (+2 from toilet and back to bed) Stand Pivot Transfer Details: Verbal cues for sequencing;Verbal cues for technique;Manual facilitation for weight shifting;Manual facilitation for weight bearing;Manual facilitation for placement Locomotion  Ambulation Ambulation: Yes Ambulation/Gait Assistance: 3: Mod assist;2: Max assist Ambulation Distance (Feet): 20 Feet Assistive device: 1 person hand held assist Gait Gait: Yes Gait Pattern: Impaired Gait Pattern: Step-to pattern;Decreased stride length;Ataxic;Wide base  of support;Abducted - left;Left circumduction Stairs / Additional Locomotion Stairs: No Wheelchair Mobility Wheelchair Mobility: No  Trunk/Postural Assessment  Cervical Assessment Cervical Assessment: Within Functional Limits Thoracic Assessment Thoracic Assessment: Exceptions to [facility] Thoracic Strength Overall Thoracic Strength Comments: forward flexed posture Lumbar Assessment Lumbar Assessment: Exceptions to [facility] Lumbar Strength Overall Lumbar Strength Comments: posterior pelvic tilt with posterior lean.  Postural Control Postural Control: Deficits on evaluation Protective Responses: Pt with decreased protective response in sitting and standing.  Postural Limitations: Pt with posterior lean in sitting with decreased ability to correct.   Balance Balance Balance Assessed: Yes Static Sitting Balance Static Sitting - Balance Support: Feet supported;Bilateral upper extremity supported Static Sitting - Level of Assistance: 5: Stand by assistance;4: Min assist Static Standing Balance Static Standing - Balance Support: During functional activity Static Standing - Level of Assistance: 3: Mod assist Dynamic Standing Balance Dynamic Standing - Balance Support: During functional activity;Right upper  extremity supported Dynamic Standing - Level of Assistance: 3: Mod assist;2: Max assist Extremity Assessment      RLE Assessment RLE Assessment: Exceptions to [facility] RLE Strength RLE Overall Strength Comments: hip flex 2+/5, hip ext 4/5, knee flex 3+/5, knee ext 4/5, ankle DF 3+/5, PF 4/5 LLE Assessment LLE Assessment: Within Functional Limits  FIM:  FIM - Bed/Chair Transfer Bed/Chair Transfer: 4: Supine > Sit: Min A (steadying Pt. > 75%/lift 1 leg);3: Bed > Chair or W/C: Mod A (lift or lower assist) FIM - Locomotion: Wheelchair Locomotion: Wheelchair: 0: Activity did not occur FIM - Locomotion: Ambulation Locomotion: Ambulation Assistive Devices: Other (comment) (HHA) Ambulation/Gait Assistance: 3: Mod assist;2: Max assist Locomotion: Ambulation: 1: Travels less than 50 ft with maximal assistance (Pt: 25 - 49%) FIM - Locomotion: Stairs Locomotion: Stairs: 0: Activity did not occur   Refer to Care Plan for Long Term Goals  Recommendations for other services: None  Discharge Criteria: Patient will be discharged from PT if patient refuses treatment 3 consecutive times without medical reason, if treatment goals not met, if there is a change in medical status, if patient makes no progress towards goals or if patient is discharged from hospital.  The above assessment, treatment plan, treatment alternatives and goals were discussed and mutually agreed upon: by patient  Denice Bors 02/02/2015, 12:55 PM

## 2015-01-15 NOTE — Significant Event (Signed)
Rapid Response Event Note  Overview:  Called to assist with patient with decreased LOC and hypotension Time Called: 1840 Arrival Time: 1842 Event Type: Hypotension  Initial Focused Assessment: On arrival patient is supine in the bed - cold clammy diaphoretic - grey in color - dry oral mucosa - responds to voice - answers simple questions - denies pain or SOB - no JVD noted - bil BS present - clear - RR 32 rapid - unable to assess O2 sats - waveform poor on monitor - pulse present - RN Ed reports BP in 60's with syncopal episode [facility].  Consulting civil engineerCharge RN on phone with PA Pam.   Interventions:  NS started wide open.  Stat call for 12 lead EKG, placed on monitor - ST 120 , placed on NRB mask.  Corine ShelterLuke Kilroy PA with cardiology to room.  12 lead without signs of acute infarct per [facility].   Spoke with Elita QuickPam PA - requested stat transfer to ICU with PCCM consult - she will call.   Lab present - attempting to draw labs.  Slightly more alert with NS bolus - remains cool clammy = continued to be responsive to voice - denies pain.  Dr. Vassie LollAlva present to room.  2nd IV line started - #20 angio to right wrist - good blood return - NS started.  BP 60/palpated.   Levophed 4mg /250 D5w started right PIV per order Dr. Vassie LollAlva.  Dr. Anne FuSkains present.  Stat portable 2D echo done.  Color little more pink - remains cool.  BP 72/ palpated.  Stat transfer to 2H07 with Zoll monitor and NRB mask.  Dr. Vassie LollAlva present - BP 72/31 HR 110 RR 24 still difficulty with O2 sat register  - poor waveform  Family updated and supported.  Handoff to U.S. BancorpLatroya RN on 2H. Family updated.     Event Summary: Name of Physician Notified: Marissa NestlePam Love at  (pta RRT)  Name of Consulting Physician Notified: Dr. Anne FuSkains    Dr. Vassie LollAlva at [phone] (per PA Marissa NestlePam Love)  Outcome: Transferred (Comment) (2 H 07)  Event End Time: 1945  Delton PrairieBritt, Bernard Donahoo L

## 2015-01-15 NOTE — Progress Notes (Signed)
Received report from Day nurse. Patient resting comfortable with family at bedside. Oriented patient rehab and offered to answer any questions. No questions at the time. Vitals stable with no c/o pain. Foye ClockKristina Templeton Endoscopy CenterDoggett,RN

## 2015-01-15 NOTE — Progress Notes (Signed)
Patient information reviewed and entered into eRehab system by Americus Scheurich, RN, CRRN, PPS Coordinator.  Information including medical coding and functional independence measure will be reviewed and updated through discharge.     Per nursing patient was given "Data Collection Information Summary for Patients in Inpatient Rehabilitation Facilities with attached "Privacy Act Statement-Health Care Records" upon admission.  

## 2015-01-15 NOTE — Progress Notes (Signed)
PMR Admission Coordinator Pre-Admission Assessment  Patient: Jerome Bowen is an 81 y.o., male MRN: [id number] DOB: 06-Apr-1934 Height:   Weight: 81.8 kg (180 lb 5.4 oz)  Insurance Information HMO: PPO: yes PCP: IPA: 80/20: OTHER: medicare advantage plan PRIMARY: [employer] Medicare Policy#: [id number] Subscriber: pt CM Name: Tish Phone#: [phone] Fax#: [phone] f/u with Kathleene HazelShanon Stacy [phone] approved for 7 days Pre-Cert#: [id number] Employer: retired Benefits: Phone #: [phone] Name: 01/13/15 Eff. Date: 09/12/14 Deduct: $290 Out of Pocket Max: $3290 Life Max: none CIR: 80% for max Cap per admission of $2218 SNF: 80% covered days 1-20 up to $40 copay per day; days 21-100 80% coverage up to cap $160 per day Outpatient: 80% Co-Pay: no visit limit Home Health: 100% Co-Pay: no visit limit DME: 80% Co-Pay: 20% Providers: in network  SECONDARY: none  Medicaid Application Date: Case Manager:  Disability Application Date: Case Worker:   Emergency Conservator, museum/galleryContact Information Contact Information    Name Relation Home Work Mobile   IrmoPickard,[name] Spouse [phone]     Mardee PostinICKARD,[name]  [phone]     Marlowe Aschoffrrichiello,Tyra    [phone]   Moser,Tina    [phone]     Current Medical History  Patient Admitting Diagnosis: cardio-embolic CVA  History of Present Illness: Jerome Bowen is a 81 y.o. right handed male with history of hypertension, hyperlipidemia and angina. Patient underwent elective cardiac cath for chest pain with abnormal stress test findings of distal RCA stenosis underwent stenting 01/08/[facility]. Following the procedure noted to be agitated as well as difficulty  speaking. MRI obtained which showed multifocal infarcts in both anterior and posterior circulation consistent with cardiac embolus [facility] Patient also had developed postoperative hematoma after cardiac catheterization. Did not receive TPA. Echocardiogram with ejection fraction of 70% systolic function was vigorous. MRI of the brain without stenosis. Neurology consulted presently maintained on aspirin and Plavix therapy. Subcutaneous heparin for DVT prophylaxis. Patient is nothing by mouth with nasogastric tube feeds after a failed modified barium swallow.  Rash developed on 01/13/2015 on back. Does not follow a dermatomal pattern and not painful. Possibly felt to be related to heat. Will apply benadryl cream and monitor.   Total: 4 NIH    Past Medical History  Past Medical History  Diagnosis Date  . Essential hypertension   . Dyslipidemia   . CAD (coronary artery disease)   . S/P right coronary artery (RCA) stent placement   . OSA (obstructive sleep apnea)   . Esophageal stricture   . Seronegative arthritis   . Fatty liver     Family History  family history includes Heart disease in his brother.  Prior Rehab/Hospitalizations: none  Current Medications   Current facility-administered medications:  . acetaminophen (TYLENOL) tablet 650 mg, 650 mg, Per Tube, Q4H PRN, Ripudeep K Rai, MD . antiseptic oral rinse (CPC / CETYLPYRIDINIUM CHLORIDE 0.05%) solution 7 mL, 7 mL, Mouth Rinse, BID, Ripudeep K Rai, MD, 7 mL at 01/14/15 1000 . aspirin suppository 300 mg, 300 mg, Rectal, Daily, 300 mg at 01/12/15 0954 **OR** aspirin tablet 325 mg, 325 mg, Oral, Daily, Rolly SalterPranav M Patel, MD, 325 mg at 01/14/15 [id number] . atorvastatin (LIPITOR) tablet 10 mg, 10 mg, Per Tube, q1800, Ripudeep K Rai, MD, 10 mg at 01/13/15 1703 . clopidogrel (PLAVIX) tablet 75 mg, 75 mg, Per NG tube, Daily, Rolly SalterPranav M Patel, MD, 75 mg at 01/14/15 [id number] . diphenhydrAMINE-zinc  acetate (BENADRYL) 2-0.1 % cream, , Topical, TID PRN, Nita SellsMaryann  Mikhail, DO . docusate sodium (COLACE) capsule 100 mg, 100 mg, Oral, BID, Maryann Mikhail, DO, 100 mg at 01/14/15 1000 . famotidine (PEPCID) tablet 20 mg, 20 mg, Oral, BID, Maryann Mikhail, DO, 20 mg at 01/14/15 0454 . heparin injection 5,000 Units, 5,000 Units, Subcutaneous, 3 times per day, Rolly Salter, MD, 5,000 Units at 01/14/15 1504 . RESOURCE THICKENUP CLEAR, , Oral, PRN, Maryann Mikhail, DO . senna (SENOKOT) tablet 8.6 mg, 1 tablet, Oral, BID, Maryann Mikhail, DO, 8.6 mg at 01/14/15 0981 . sodium chloride (OCEAN) 0.65 % nasal spray 1 spray, 1 spray, Each Nare, PRN, Rolly Salter, MD  Patients Current Diet: Dysphagia 1 diet with nectar thick liquids. Pills crushed in puree  Precautions / Restrictions Precautions Precautions: Fall Precaution Comments: panda Restrictions Weight Bearing Restrictions: No   Prior Activity Level Community (5-7x/wk): Independent without AD; Driving, active . Retired Sport and exercise psychologist  Very active and independent Optician, dispensing / Corporate investment banker Devices/Equipment: CPAP Home Equipment: None  Prior Functional Level Prior Function Level of Independence: Independent  Current Functional Level Cognition  Overall Cognitive Status: Within Functional Limits for tasks assessed Orientation Level: Oriented X4   Extremity Assessment (includes Sensation/Coordination)  Upper Extremity Assessment: Defer to OT evaluation RUE Deficits / Details: decr fine motor and gross motor. Pt able to hold a pen and write name/ wife name . Pt able to alternate to the L hand and complete task in print with more legible text  Lower Extremity Assessment: RLE deficits/detail RLE Deficits / Details: 4/5 strength grossly graded RLE Sensation: decreased proprioception RLE Coordination: decreased fine motor    ADLs  Overall ADL's : Needs assistance/impaired Eating/Feeding:  NPO Eating/Feeding Details (indicate cue type and reason): pt wanting ice chips like at [facility] Grooming: Wash/dry face, Min guard, Sitting Grooming Details (indicate cue type and reason): needed incr time to avoid panda tube Upper Body Bathing: Moderate assistance, Sitting Upper Body Bathing Details (indicate cue type and reason): difficulty holding with R UE however pt attempting to wrap with L UE. Pt actively using R UE however Lower Body Bathing: Moderate assistance, Sit to/from stand Lower Body Bathing Details (indicate cue type and reason): required sitting . standing for peri care and required (A) for detail to hygiene Upper Body Dressing : Supervision/safety, Sitting Toilet Transfer: +2 for physical assistance, Minimal assistance, Ambulation Toilet Transfer Details (indicate cue type and reason): cues for bil UE to push from chair  Functional mobility during ADLs: +2 for physical assistance, Minimal assistance General ADL Comments: Pt transfered to bathroom for bathing task. pt actively using R Ue with bathing task. pt with LOB with static standing and requires sitting for this time. pt noted to have small blister on back and staff notified     Mobility  Overal bed mobility: Needs Assistance Bed Mobility: Sit to Supine Rolling: Min assist Supine to sit: Mod assist Sit to supine: Min assist General bed mobility comments: in chair     Transfers  Overall transfer level: Needs assistance Equipment used: 2 person hand held assist Transfers: Sit to/from Stand Sit to Stand: +2 physical assistance, Min assist General transfer comment: Pt with R lateral LOB with static standing. pt encouraged to place feet shoulder width apart. Pt with lateral LOB with head turn to transfer to bathroom. Pt allowed LOB and unable to correct. Pt required total (A) to remain static standing    Ambulation / Gait / Stairs / Wheelchair Mobility  Ambulation/Gait Ambulation/Gait assistance: Mod  assist  Ambulation Distance (Feet): 60 Feet (x2) Assistive device: 1 person hand held assist General Gait Details: improved ability to faciliate RLE stride and placement this session. Patient able recognize positioning and postural alignment with less cues this session. Seated rest break regquied Gait Pattern/deviations: Step-through pattern, Decreased stride length, Ataxic, Decreased weight shift to right Gait velocity: decreased Gait velocity interpretation: Below normal speed for age/gender    Posture / Balance Dynamic Sitting Balance Sitting balance - Comments: pt able to static sit EOB  Balance Overall balance assessment: Needs assistance Sitting-balance support: Bilateral upper extremity supported, Feet supported Sitting balance-Leahy Scale: Fair Sitting balance - Comments: pt able to static sit EOB  Postural control: Right lateral lean Standing balance support: Bilateral upper extremity supported, During functional activity Standing balance-Leahy Scale: Poor    Special needs/care consideration BiPAP/CPAP yes pta Bowel mgmt:continent Bladder mgmt: continent    Previous Home Environment Living Arrangements: Spouse/significant other Lives With: Spouse Available Help at Discharge: Family, Available 24 hours/day Type of Home: House Home Layout: Two level, Able to live on main level with bedroom/bathroom Home Access: Stairs to enter Entrance Stairs-Rails: Right Entrance Stairs-Number of Steps: 2 Home Care Services: No  Discharge Living Setting Plans for Discharge Living Setting: Patient's home, Lives with (comment), Other (Comment) (wife) Type of Home at Discharge: House Discharge Home Layout: Two level, Able to live on main level with bedroom/bathroom Alternate Level Stairs-Rails: Right Discharge Home Access: Stairs to enter Entrance Stairs-Rails: Right Entrance Stairs-Number of Steps: 2 Does the patient have any problems obtaining your medications?:  No  Social/Family/Support Systems Patient Roles: Spouse, Parent Contact Information: Mardee PostinLinda Basques, wife Anticipated Caregiver: wife and children Anticipated Caregiver's Contact Information: see above Ability/Limitations of Caregiver: no limitations Caregiver Availability: 24/7 Discharge Plan Discussed with Primary Caregiver: Yes Is Caregiver In Agreement with Plan?: Yes Does Caregiver/Family have Issues with Lodging/Transportation while Pt is in Rehab?: No (wife or daughters stay with pt 24/7 in hospital)  Goals/Additional Needs Patient/Family Goal for Rehab: supervision with PT, OT, and SLP Expected length of stay: ELOS 10- 16 days Dietary Needs: NPO with panda feeds Pt/Family Agrees to Admission and willing to participate: Yes Program Orientation Provided & Reviewed with Pt/Caregiver Including Roles & Responsibilities: Yes  Decrease burden of Care through IP rehab admission: n/a  Possible need for SNF placement upon discharge:not anticipated  Patient Condition: This patient's medical and functional status has changed since the consult dated: 5/2/2016in which the Rehabilitation Physician determined and documented that the patient's condition is appropriate for intensive rehabilitative care in an inpatient rehabilitation facility. See "History of Present Illness" (above) for medical update. Functional changes are: mod assist. Patient's medical and functional status update has been discussed with the Rehabilitation physician and patient remains appropriate for inpatient rehabilitation. Will admit to inpatient rehab today.  Preadmission Screen Completed By: Clois DupesBoyette, Michelena Culmer Godwin, 01/14/2015 5:06 PM ______________________________________________________________________  Discussed status with Dr. Riley KillSwartz on 01/14/15 at 1705 and received telephone approval for admission today.  Admission Coordinator: Clois DupesBoyette, Virdell Hoiland Godwin, time [phone] Date 01/14/2015.          Cosigned by: Ranelle OysterZachary  T Swartz, MD at 01/14/2015 5:44 PM  Revision History     Date/Time User Provider Type Action   01/14/2015 5:44 PM Ranelle OysterZachary T Swartz, MD Physician Cosign   01/14/2015 5:06 PM Standley BrookingBarbara G Acelynn Dejonge, RN Rehab Admission Coordinator Sign

## 2015-01-15 NOTE — Consult Note (Signed)
PULMONARY / CRITICAL CARE MEDICINE   Name: Jerome Bowen MRN: [id number] DOB: Mar 17, 1934    ADMISSION DATE:  01/13/2015 CONSULTATION DATE:  01/11/2015  REFERRING MD :  CIR  CHIEF COMPLAINT:  Syncope, hypotension  INITIAL PRESENTATION:  81 y.o. M who had DES placed to RCA on 4/28 at [facility].  Suffered acute CVA following procedure, transferred to CIR at [facility] 5/4.  5/5 had newly diagnosed DVT then went on to have syncope and hypotension.  Transferred to ICU and started on heparin for presumed PE (bedside echo by Dr. Anne Fu revealed dilated RV).  STUDIES:  MRI brain 4/29 >>> acute bilatereal infarcts of brainstem, occipital lobe, left parietal lobe, posterior right frontal lobe. LE Venous Doppler 5/5 >>> small are of the mid right peroneal veins appear to have DVT. Bedside echo 5/5 (Dr. Anne Fu) >>> dilated RV with decreased contractility.  IVC did not appear to have respiratory variation indicating elevated CVP.  There was no pericardial effusion and LVEF was normal (65-70%).  SIGNIFICANT EVENTS: 4/28 - DES placed to RCA 5/4 - [facility] 5/5 - new RLE DVT found, syncopal event, transferred to ICU and started on heparin gtt for presumed PE   HISTORY OF PRESENT ILLNESS:  Pt is encephalopathic; therefore, this HPI is obtained from chart review. Jerome Bowen is a 81 y.o. M with PMH as outlined below.  He was at [facility] on 01/08/15 and had a DES placed to his RCA following an abnormal myoview.  Following procedure, pt noted to have aphasia and aguitation.  MRI of the brain revealed multifocal infarcts in both anterior and posterior circulation c/w cardiac embolus.  Pt also had post op hematoma of the right groin following cardiac cath (never received tPA). On 5/4, he was subsequently transferred to CIR at [facility] for continued rehab efforts.  On 5/5, he became orthostatic after getting up to use the restroom.  He was given an IVF bolus and responded appropriately.  Rehab team saw pt and  ordered LE venous dopplers for RLE swelling.  Venous studies returned preliminarily positive for a small area of DVT in the mid right peroneal veins. Later that evening, he was being helped to the bedside commode when he once again became hypotensive followed by a syncopal event.  Rapid response was called and pt was transferred to ICU. He was seen in consultation by Dr. Anne Fu of cardiology who performed a bedside echo which showed a dilated RV with decreased contractility.  IVC did not appear to have respiratory variation indicating elevated CVP.  There was no pericardial effusion and LVEF was normal (65-70%).  Due to known DVT, PE was considered most likely diagnosis.  Order for heparin infusion was placed and formal echo pending.  Once pt's BP stabilizes (hopefully in next hour or two), will consider CTA chest for further evaluation.   PAST MEDICAL HISTORY :   has a past medical history of Essential hypertension; Dyslipidemia; CAD (coronary artery disease); S/P right coronary artery (RCA) stent placement; OSA (obstructive sleep apnea); Esophageal stricture; Seronegative arthritis; Fatty liver; and Peripheral vascular disease.  has past surgical history that includes Cardiac catheterization and Colonoscopy. Prior to Admission medications   Medication Sig Start Date End Date Taking? Authorizing Provider  aspirin EC 325 MG tablet Take 325 mg by mouth daily.    Historical Provider, MD  atorvastatin (LIPITOR) 10 MG tablet Place 1 tablet (10 mg total) into feeding tube daily at 6 PM. 01/14/15   Edsel Petrin,  DO  cholecalciferol (VITAMIN D) 1000 UNITS tablet Take 1,000 Units by mouth daily.    Historical Provider, MD  clopidogrel (PLAVIX) 75 MG tablet Take 75 mg by mouth daily.    Historical Provider, MD  Cyanocobalamin 1000 MCG TBCR Take 1 tablet by mouth daily.    Historical Provider, MD  diphenhydrAMINE-zinc acetate (BENADRYL) cream Apply topically 3 (three) times daily as needed for itching.  01/14/15   Maryann Mikhail, DO  docusate sodium (COLACE) 100 MG capsule Take 1 capsule (100 mg total) by mouth 2 (two) times daily. 01/14/15   Maryann Mikhail, DO  famotidine (PEPCID) 20 MG tablet Take 1 tablet (20 mg total) by mouth 2 (two) times daily. 01/14/15   Maryann Mikhail, DO  losartan (COZAAR) 100 MG tablet Take 100 mg by mouth daily.    Historical Provider, MD  Maltodextrin-Xanthan Gum (RESOURCE THICKENUP CLEAR) POWD Use as needed. 01/14/15   Maryann Mikhail, DO  senna (SENOKOT) 8.6 MG TABS tablet Take 1 tablet (8.6 mg total) by mouth 2 (two) times daily. 01/14/15   Maryann Mikhail, DO  sodium chloride (OCEAN) 0.65 % SOLN nasal spray Place 1 spray into both nostrils as needed for congestion. 01/14/15   Maryann Mikhail, DO  tamsulosin (FLOMAX) 0.4 MG CAPS capsule Take 0.4 mg by mouth daily.     Historical Provider, MD  vitamin E 1000 UNIT capsule Take 1,000 Units by mouth daily.    Historical Provider, MD   No Known Allergies  FAMILY HISTORY:  Family History  Problem Relation Age of Onset  . Heart disease Brother     SOCIAL HISTORY:  reports that he has quit smoking. His smoking use included Cigarettes. He has quit using smokeless tobacco. He reports that he drinks alcohol. He reports that he does not use illicit drugs.  REVIEW OF SYSTEMS:  Unable to obtain as pt is encephalopathic.  SUBJECTIVE:   VITAL SIGNS: Temp:  [97.4 F (36.3 C)-98.7 F (37.1 C)] 97.8 F (36.6 C) (05/05 1342) Pulse Rate:  [78-125] 122 (05/05 1842) Resp:  [18-22] 22 (05/05 1842) BP: (64-149)/(45-78) 64/48 mmHg (05/05 1842) SpO2:  [88 %-98 %] 92 % (05/05 1842) HEMODYNAMICS:   VENTILATOR SETTINGS:   INTAKE / OUTPUT: Intake/Output    None     PHYSICAL EXAMINATION: General: Elderly male, chronically ill appearing, in NAD. Neuro: Awake, answers simple questions appropriately.  MAE's, unable to test strength. HEENT: [place]/AT. PERRL, sclerae anicteric, arcus senilis. Cardiovascular: Tachy, regular, SEM  noted. Lungs: Respirations shallow and unlabored.  Coarse bilaterally. Abdomen: BS x 4, soft, NT/ND.  Musculoskeletal: No gross deformities, mild RLE edema. R groin hematoma / ecchymosis. Skin: Intact, warm, no rashes.  LABS:  CBC  Recent Labs Lab 01/14/15 2103 28-Apr-2015 0614 28-Apr-2015 1916  WBC 6.7 6.3 11.7*  HGB 11.4* 11.7* 12.7*  HCT 33.1* 33.8* 36.8*  PLT 231 234 348   Coag's  Recent Labs Lab 01/09/15 2304  APTT 31  INR 1.18   BMET  Recent Labs Lab 01/09/15 2304 01/10/15 0744 01/14/15 2103 28-Apr-2015 0614  NA 142 140  --  141  K 3.8 3.9  --  3.8  CL 110 110  --  108  CO2 23 22  --  25  BUN 19 18  --  21*  CREATININE 0.96 0.92 0.85 0.88  GLUCOSE 101* 94  --  102*   Electrolytes  Recent Labs Lab 01/09/15 2304 01/10/15 0744 28-Apr-2015 0614  CALCIUM 8.5 8.7 8.7*   Sepsis Markers No results for input(s):  LATICACIDVEN, PROCALCITON, O2SATVEN in the last 168 hours. ABG  Recent Labs Lab Nov 11, 2014 2010  PHART 7.370  PCO2ART 32.5*  PO2ART 297.0*   Liver Enzymes  Recent Labs Lab 01/09/15 2304 Nov 11, 2014 0614  AST 21 18  ALT 20 16*  ALKPHOS 45 52  BILITOT 0.8 0.8  ALBUMIN 3.0* 2.7*   Cardiac Enzymes No results for input(s): TROPONINI, PROBNP in the last 168 hours. Glucose  Recent Labs Lab 01/12/15 2103 01/13/15 0048 01/13/15 0501 01/13/15 0754 01/13/15 1128 Nov 11, 2014 1853  GLUCAP 145* 112* 135* 139* 105* 162*    Imaging Dg Chest Port 1 View  01/29/2015   CLINICAL DATA:  Central line placement  EXAM: PORTABLE CHEST - 1 VIEW  COMPARISON:  12/16/2014  FINDINGS: New left IJ central line, tip at the SVC level. No pneumothorax or new mediastinal widening. There is no edema, consolidation, effusion, or pneumothorax. Calcified granuloma noted in the right upper lung. Normal heart size and mediastinal contours.  IMPRESSION: The new left IJ catheter is in good position.  No pneumothorax.   Electronically Signed   By: Marnee SpringJonathon  Watts M.D.   On: 10/14/2014  20:43     ASSESSMENT / PLAN:  PULMONARY A: Acute hypoxic respiratory failure - ABG reassuring, pt appears improved and does not currently require intubation High suspicion for PE OSA At risk aspiration following CVA P:   Continue supplemental O2 as needed to maintain SpO2 > 92%. Heparin gtt empirically. CTA chest once BP stabilized. NPO. Pulmonary hygiene. CXR intermittently.  NEUROLOGIC A:   Acute multifocal CVA following cardiac cath with DES placement 01/08/15 Syncope - presumed to do PE P:   Continue aspirin, plavix. Once stabilized, then back to CIR for continued rehab efforts. Pt to follow up with neurology as outpatient.  CARDIOVASCULAR CVL L IJ 5/5 >>> A:  Shock - presumed obstructive in setting of PE CAD s/p DES to RCA 01/08/15 Hx essential HTN, PVD, HLD P:  Levophed for goal MAP > 65. Check BNP, lactate. Trend troponins. Continue ASA, plavix. Monitor hemodynamics.  RENAL A:   Pseudohypocalcemia - corrects to 9.74 P:   Check ionized calcium. NS @ 125. BMP in AM.  GASTROINTESTINAL A:   Dysphagia following CVA - failed MBS initially; however, diet advanced to dysphagia 1 on 01/13/15 Hx esophageal stricture, fatty liver P:   NPO for now. If remains stable overnight, can consider restarting dysphagia 1 diet in AM.  HEMATOLOGIC A:   VTE Prophylaxis P:  SCD's / Heparin gtt. CBC in AM.  INFECTIOUS A:   No indication of infection P:   Monitor clinically.  ENDOCRINE A:   No known issues P:   Monitor glucose on BMP.   Family updated: Multiple family members updated by Dr. Vassie LollAlva.  Interdisciplinary Family Meeting v Palliative Care Meeting:  Due by: 01/21/15.   Rutherford Guysahul Katelen Luepke, [place]PA - [facility] Pager: [phone]  or [phone] 02/10/2015, 8:23 PM

## 2015-01-16 ENCOUNTER — Inpatient Hospital Stay (HOSPITAL_COMMUNITY): Payer: Medicare Other

## 2015-01-16 ENCOUNTER — Inpatient Hospital Stay (HOSPITAL_COMMUNITY): Payer: Medicare Other | Admitting: Rehabilitation

## 2015-01-16 ENCOUNTER — Inpatient Hospital Stay (HOSPITAL_COMMUNITY): Payer: Medicare Other | Admitting: Occupational Therapy

## 2015-01-16 DIAGNOSIS — R570 Cardiogenic shock: Secondary | ICD-10-CM | POA: Insufficient documentation

## 2015-01-16 DIAGNOSIS — I2583 Coronary atherosclerosis due to lipid rich plaque: Secondary | ICD-10-CM

## 2015-01-16 DIAGNOSIS — R778 Other specified abnormalities of plasma proteins: Secondary | ICD-10-CM | POA: Insufficient documentation

## 2015-01-16 DIAGNOSIS — J9601 Acute respiratory failure with hypoxia: Secondary | ICD-10-CM

## 2015-01-16 DIAGNOSIS — I469 Cardiac arrest, cause unspecified: Secondary | ICD-10-CM

## 2015-01-16 DIAGNOSIS — R7989 Other specified abnormal findings of blood chemistry: Secondary | ICD-10-CM

## 2015-01-16 DIAGNOSIS — I2510 Atherosclerotic heart disease of native coronary artery without angina pectoris: Secondary | ICD-10-CM | POA: Insufficient documentation

## 2015-01-16 LAB — BASIC METABOLIC PANEL
Anion gap: 24 — ABNORMAL HIGH (ref 5–15)
BUN: 44 mg/dL — AB (ref 6–20)
CO2: 20 mmol/L — ABNORMAL LOW (ref 22–32)
CREATININE: 3.89 mg/dL — AB (ref 0.61–1.24)
Calcium: 7.5 mg/dL — ABNORMAL LOW (ref 8.9–10.3)
Chloride: 109 mmol/L (ref 101–111)
GFR calc non Af Amer: 13 mL/min — ABNORMAL LOW (ref >60)
GFR, EST AFRICAN AMERICAN: 16 mL/min — AB (ref >60)
GLUCOSE: 152 mg/dL — AB (ref 70–99)
Potassium: 4.8 mmol/L (ref 3.5–5.1)
Sodium: 153 mmol/L — ABNORMAL HIGH (ref 135–145)

## 2015-01-16 LAB — POCT I-STAT 3, ART BLOOD GAS (G3+)
ACID-BASE DEFICIT: 23 mmol/L — AB (ref 0.0–2.0)
ACID-BASE DEFICIT: 18 mmol/L — AB (ref 0.0–2.0)
Acid-base deficit: 21 mmol/L — ABNORMAL HIGH (ref 0.0–2.0)
BICARBONATE: 5.7 meq/L — AB (ref 20.0–24.0)
Bicarbonate: 12.1 mEq/L — ABNORMAL LOW (ref 20.0–24.0)
Bicarbonate: 13.3 mEq/L — ABNORMAL LOW (ref 20.0–24.0)
O2 SAT: 98 %
O2 Saturation: 100 %
O2 Saturation: 99 %
PCO2 ART: 64.2 mmHg — AB (ref 35.0–45.0)
PH ART: 6.883 — AB (ref 7.350–7.450)
PH ART: 6.969 — AB (ref 7.350–7.450)
PO2 ART: 139 mmHg — AB (ref 80.0–100.0)
Patient temperature: 98.6
Patient temperature: 98.6
TCO2: 6 mmol/L (ref 0–100)
TCO2: 14 mmol/L (ref 0–100)
TCO2: 15 mmol/L (ref 0–100)
pCO2 arterial: 19.9 mmHg — CL (ref 35.0–45.0)
pCO2 arterial: 58.1 mmHg (ref 35.0–45.0)
pH, Arterial: 7.066 — CL (ref 7.350–7.450)
pO2, Arterial: 400 mmHg — ABNORMAL HIGH (ref 80.0–100.0)
pO2, Arterial: 187 mmHg — ABNORMAL HIGH (ref 80.0–100.0)

## 2015-01-16 LAB — URINALYSIS, ROUTINE W REFLEX MICROSCOPIC
Bilirubin Urine: NEGATIVE
Glucose, UA: NEGATIVE mg/dL
Hgb urine dipstick: NEGATIVE
KETONES UR: NEGATIVE mg/dL
LEUKOCYTES UA: NEGATIVE
NITRITE: NEGATIVE
PROTEIN: 30 mg/dL — AB
Specific Gravity, Urine: 1.019 (ref 1.005–1.030)
Urobilinogen, UA: 1 mg/dL (ref 0.0–1.0)
pH: 6 (ref 5.0–8.0)

## 2015-01-16 LAB — CBC
HCT: 36.8 % — ABNORMAL LOW (ref 39.0–52.0)
HEMOGLOBIN: 12.9 g/dL — AB (ref 13.0–17.0)
MCH: 33.1 pg (ref 26.0–34.0)
MCHC: 35.1 g/dL (ref 30.0–36.0)
MCV: 94.4 fL (ref 78.0–100.0)
Platelets: 367 10*3/uL (ref 150–400)
RBC: 3.9 MIL/uL — ABNORMAL LOW (ref 4.22–5.81)
RDW: 13.6 % (ref 11.5–15.5)
WBC: 14.9 10*3/uL — ABNORMAL HIGH (ref 4.0–10.5)

## 2015-01-16 LAB — GLUCOSE, CAPILLARY: Glucose-Capillary: 150 mg/dL — ABNORMAL HIGH (ref 70–99)

## 2015-01-16 LAB — URINE MICROSCOPIC-ADD ON

## 2015-01-16 LAB — OCCULT BLOOD X 1 CARD TO LAB, STOOL: FECAL OCCULT BLD: POSITIVE — AB

## 2015-01-16 LAB — TROPONIN I
Troponin I: 2.88 ng/mL (ref <0.031)
Troponin I: 4.51 ng/mL (ref <0.031)
Troponin I: 4.6 ng/mL (ref <0.031)

## 2015-01-16 LAB — HEPARIN LEVEL (UNFRACTIONATED): Heparin Unfractionated: 0.62 IU/mL (ref 0.30–0.70)

## 2015-01-16 LAB — ABO/RH: ABO/RH(D): O NEG

## 2015-01-16 MED ORDER — MORPHINE SULFATE 10 MG/ML IJ SOLN
10.0000 mg | Freq: Once | INTRAMUSCULAR | Status: AC
  Administered 2015-01-16: 10 mg via INTRAVENOUS

## 2015-01-16 MED ORDER — FENTANYL CITRATE (PF) 100 MCG/2ML IJ SOLN
INTRAMUSCULAR | Status: AC
  Administered 2015-01-16: 100 ug
  Filled 2015-01-16: qty 2

## 2015-01-16 MED ORDER — SODIUM BICARBONATE 8.4 % IV SOLN
INTRAVENOUS | Status: DC
  Administered 2015-01-16: 09:00:00 via INTRAVENOUS
  Filled 2015-01-16 (×3): qty 150

## 2015-01-16 MED ORDER — MORPHINE SULFATE 4 MG/ML IJ SOLN
INTRAMUSCULAR | Status: AC
  Administered 2015-01-16: 4 mg
  Filled 2015-01-16: qty 1

## 2015-01-16 MED ORDER — SODIUM CHLORIDE 0.9 % IV BOLUS (SEPSIS)
500.0000 mL | Freq: Once | INTRAVENOUS | Status: AC
  Administered 2015-01-16: 500 mL via INTRAVENOUS

## 2015-01-16 MED ORDER — ASPIRIN 81 MG PO CHEW: 81.0000 mg | CHEWABLE_TABLET | Freq: Every day | ORAL | Status: DC

## 2015-01-16 MED ORDER — VITAL AF 1.2 CAL PO LIQD
1000.0000 mL | ORAL | Status: DC
  Filled 2015-01-16 (×2): qty 1000

## 2015-01-16 MED ORDER — VITAL HIGH PROTEIN PO LIQD: 1000.0000 mL | ORAL | Status: DC

## 2015-01-16 MED ORDER — MORPHINE SULFATE 10 MG/ML IJ SOLN: 10.0000 mg | Freq: Once | INTRAMUSCULAR | Status: DC

## 2015-01-16 MED ORDER — SODIUM CHLORIDE 0.9 % IV SOLN: 1.0000 mg/h | INTRAVENOUS | Status: DC

## 2015-01-16 MED ORDER — MORPHINE SULFATE 4 MG/ML IJ SOLN
INTRAMUSCULAR | Status: AC
  Administered 2015-01-16: 10 mg
  Filled 2015-01-16: qty 3

## 2015-01-16 MED ORDER — SODIUM BICARBONATE 8.4 % IV SOLN
INTRAVENOUS | Status: AC
  Filled 2015-01-16: qty 100

## 2015-01-16 MED ORDER — NOREPINEPHRINE BITARTRATE 1 MG/ML IV SOLN
0.0000 ug/min | INTRAVENOUS | Status: DC
  Administered 2015-01-16: 25 ug/min via INTRAVENOUS
  Filled 2015-01-16 (×3): qty 16

## 2015-01-16 MED ORDER — MIDAZOLAM HCL 2 MG/2ML IJ SOLN
INTRAMUSCULAR | Status: AC
  Administered 2015-01-16: 2 mg
  Filled 2015-01-16: qty 2

## 2015-01-16 MED ORDER — SODIUM BICARBONATE 8.4 % IV SOLN
INTRAVENOUS | Status: AC
  Administered 2015-01-16: 09:00:00
  Filled 2015-01-16: qty 50

## 2015-01-16 MED ORDER — CLOPIDOGREL BISULFATE 75 MG PO TABS: 75.0000 mg | ORAL_TABLET | Freq: Every day | ORAL | Status: DC

## 2015-01-16 MED ORDER — EPINEPHRINE HCL 0.1 MG/ML IJ SOSY
PREFILLED_SYRINGE | INTRAMUSCULAR | Status: AC
  Administered 2015-01-16: 1 mg
  Filled 2015-01-16: qty 10

## 2015-01-16 MED ORDER — EPINEPHRINE HCL 0.1 MG/ML IJ SOSY
PREFILLED_SYRINGE | INTRAMUSCULAR | Status: AC
  Filled 2015-01-16: qty 20

## 2015-01-16 MED ORDER — SODIUM CHLORIDE 0.9 % IV BOLUS (SEPSIS)
1000.0000 mL | Freq: Once | INTRAVENOUS | Status: AC
  Administered 2015-01-16: 1000 mL via INTRAVENOUS

## 2015-01-17 LAB — CALCIUM, IONIZED: CALCIUM, IONIZED, SERUM: 4.3 mg/dL — AB (ref 4.5–5.6)

## 2015-01-19 ENCOUNTER — Telehealth: Payer: Self-pay

## 2015-01-19 NOTE — Telephone Encounter (Signed)
On 01/19/2015 I received a death certificate from [facility] [employer] [facility]. This patient is a patient of Doctor Molli KnockYacoub... Per Doctor Molli KnockYacoub this certificate will be taken to 2H for signature this afternoon. He will sign it either this afternoon or first thing tomorrow am. On 01/20/2015 I received the death certificate back completed. I called the funeral home to let them know the death certificate was ready to be picked up.

## 2015-01-20 ENCOUNTER — Telehealth: Payer: Self-pay

## 2015-01-20 NOTE — Telephone Encounter (Signed)
On 01/20/2015 I Received a death certificate from [employer] [facility] [employer] [facility]. This patient is a patient of Doctor Sood. I am taking the death certificate to the 2nd floor of [place] [facility] for signature this am. This note was entered by error

## 2015-02-11 MED FILL — Medication: Qty: 1 | Status: AC

## 2015-02-11 NOTE — Progress Notes (Signed)
  Echocardiogram 2D Echocardiogram limited has been performed.  Jerome Bowen 01/12/2015, 11:14 AM

## 2015-02-11 NOTE — Progress Notes (Signed)
Pt transferred to acute from CIR for medical issues.  Very devoted family and plan to take him home, so hopeful will return to CIR when medically stable. [employer] made aware of transfer last night.

## 2015-02-11 NOTE — H&P (Signed)
PULMONARY / CRITICAL CARE MEDICINE   Name: Jerome Bowen MRN: [id number] DOB: 03/29/1934    ADMISSION DATE:  01/18/2015 CONSULTATION DATE:  Feb 13, 2015  REFERRING MD :  CIR  CHIEF COMPLAINT:  Syncope, hypotension  INITIAL PRESENTATION:  81 y.o. M who had DES placed to RCA on 4/28 at [facility].  Suffered acute CVA following procedure, transferred to CIR at [facility] 5/4.  5/5 had newly diagnosed DVT then went on to have syncope and hypotension.  Transferred to ICU and started on heparin for presumed PE (bedside echo by Dr. Anne Fu revealed dilated RV).  STUDIES:  MRI brain 4/29 >>> acute bilatereal infarcts of brainstem, occipital lobe, left parietal lobe, posterior right frontal lobe. LE Venous Doppler 5/5 >>> small are of the mid right peroneal veins appear to have DVT. Bedside echo 5/5 (Dr. Anne Fu) >>> dilated RV with decreased contractility.  IVC did not appear to have respiratory variation indicating elevated CVP.  There was no pericardial effusion and LVEF was normal (65-70%).  SIGNIFICANT EVENTS: 4/28 - DES placed to RCA 5/4 - [facility] 5/[facility] - new RLE DVT found, syncopal event, transferred to ICU and started on heparin gtt for presumed PE   HISTORY OF PRESENT ILLNESS:  Pt is encephalopathic; therefore, this HPI is obtained from chart review. Jerome Bowen is a 81 y.o. M with PMH as outlined below.  He was at [facility] on 01/08/15 and had a DES placed to his RCA following an abnormal myoview.  Following procedure, pt noted to have aphasia and aguitation.  MRI of the brain revealed multifocal infarcts in both anterior and posterior circulation c/w cardiac embolus.  Pt also had post op hematoma of the right groin following cardiac cath (never received tPA). On 5/4, he was subsequently transferred to CIR at [facility] for continued rehab efforts.  On 5/5, he became orthostatic after getting up to use the restroom.  He was given an IVF bolus and responded appropriately.  Rehab team saw pt and  ordered LE venous dopplers for RLE swelling.  Venous studies returned preliminarily positive for a small area of DVT in the mid right peroneal veins. Later that evening, he was being helped to the bedside commode when he once again became hypotensive followed by a syncopal event.  Rapid response was called and pt was transferred to ICU. He was seen in consultation by Dr. Anne Fu of cardiology who performed a bedside echo which showed a dilated RV with decreased contractility.  IVC did not appear to have respiratory variation indicating elevated CVP.  There was no pericardial effusion and LVEF was normal (65-70%).  Due to known DVT, PE was considered most likely diagnosis.  Order for heparin infusion was placed and formal echo pending.  Once pt's BP stabilizes (hopefully in next hour or two), will consider CTA chest for further evaluation.   PAST MEDICAL HISTORY :   has a past medical history of Essential hypertension; Dyslipidemia; CAD (coronary artery disease); S/P right coronary artery (RCA) stent placement; OSA (obstructive sleep apnea); Esophageal stricture; Seronegative arthritis; Fatty liver; and Peripheral vascular disease.  has past surgical history that includes Cardiac catheterization and Colonoscopy. Prior to Admission medications   Medication Sig Start Date End Date Taking? Authorizing Provider  aspirin EC 325 MG tablet Take 325 mg by mouth daily.    Historical Provider, MD  atorvastatin (LIPITOR) 10 MG tablet Place 1 tablet (10 mg total) into feeding tube daily at 6 PM. 01/14/15   Edsel Petrin,  DO  cholecalciferol (VITAMIN D) 1000 UNITS tablet Take 1,000 Units by mouth daily.    Historical Provider, MD  clopidogrel (PLAVIX) 75 MG tablet Take 75 mg by mouth daily.    Historical Provider, MD  Cyanocobalamin 1000 MCG TBCR Take 1 tablet by mouth daily.    Historical Provider, MD  diphenhydrAMINE-zinc acetate (BENADRYL) cream Apply topically 3 (three) times daily as needed for itching.  01/14/15   Maryann Mikhail, DO  docusate sodium (COLACE) 100 MG capsule Take 1 capsule (100 mg total) by mouth 2 (two) times daily. 01/14/15   Maryann Mikhail, DO  famotidine (PEPCID) 20 MG tablet Take 1 tablet (20 mg total) by mouth 2 (two) times daily. 01/14/15   Maryann Mikhail, DO  losartan (COZAAR) 100 MG tablet Take 100 mg by mouth daily.    Historical Provider, MD  Maltodextrin-Xanthan Gum (RESOURCE THICKENUP CLEAR) POWD Use as needed. 01/14/15   Maryann Mikhail, DO  senna (SENOKOT) 8.6 MG TABS tablet Take 1 tablet (8.6 mg total) by mouth 2 (two) times daily. 01/14/15   Maryann Mikhail, DO  sodium chloride (OCEAN) 0.65 % SOLN nasal spray Place 1 spray into both nostrils as needed for congestion. 01/14/15   Maryann Mikhail, DO  tamsulosin (FLOMAX) 0.4 MG CAPS capsule Take 0.4 mg by mouth daily.     Historical Provider, MD  vitamin E 1000 UNIT capsule Take 1,000 Units by mouth daily.    Historical Provider, MD   No Known Allergies  FAMILY HISTORY:  Family History  Problem Relation Age of Onset  . Heart disease Brother     SOCIAL HISTORY:  reports that he has quit smoking. His smoking use included Cigarettes. He has quit using smokeless tobacco. He reports that he drinks alcohol. He reports that he does not use illicit drugs.  REVIEW OF SYSTEMS:  Unable to obtain as pt is encephalopathic.  SUBJECTIVE:   VITAL SIGNS: Temp:  [97.1 F (36.2 C)-98.5 F (36.9 C)] 97.1 F (36.2 C) (05/06 0400) Pulse Rate:  [78-125] 125 (05/06 0600) Resp:  [16-35] 30 (05/06 0600) BP: (64-118)/(45-93) 113/62 mmHg (05/06 0600) SpO2:  [88 %-100 %] 99 % (05/06 0600) Arterial Line BP: (71-121)/(51-75) 110/71 mmHg (05/06 0600) Weight:  [179 lb 0.2 oz (81.2 kg)-180 lb 12.4 oz (82 kg)] 180 lb 12.4 oz (82 kg) (05/06 0500) HEMODYNAMICS: CVP:  [14 mmHg] 14 mmHg VENTILATOR SETTINGS:   INTAKE / OUTPUT: Intake/Output      05/05 0701 - 05/06 0700 05/06 0701 - 05/07 0700   I.V. (mL/kg) 421.9 (5.1)    Total Intake(mL/kg)  421.9 (5.1)    Urine (mL/kg/hr) 235    Stool 0    Total Output 235     Net +186.9          Stool Occurrence 3 x      PHYSICAL EXAMINATION: General: Elderly male, chronically ill appearing, in NAD. Neuro: Awake, answers simple questions appropriately.  MAE's, unable to test strength. HEENT: [place]/AT. PERRL, sclerae anicteric, arcus senilis. Cardiovascular: Tachy, regular, SEM noted. Lungs: Respirations shallow and unlabored.  Coarse bilaterally. Abdomen: BS x 4, soft, NT/ND.  Musculoskeletal: No gross deformities, mild RLE edema. R groin hematoma / ecchymosis. Skin: Intact, warm, no rashes.  LABS:  CBC  Recent Labs Lab 01/13/2015 0614 01/12/2015 1916 01/21/2015 2200  WBC 6.3 11.7* 14.9*  HGB 11.7* 12.7* 12.0*  HCT 33.8* 36.8* 34.6*  PLT 234 348 384   Coag's  Recent Labs Lab 01/09/15 2304 02/09/2015 2200  APTT 31 183*  INR 1.18 1.39   BMET  Recent Labs Lab 2015/02/11 0614 02/11/15 1916 February 11, 2015 2200  NA 141 142 140  K 3.8 4.5 4.4  CL 108 109 111  CO2 25 16* 16*  BUN 21* 33* 35*  CREATININE 0.88 1.66* 2.03*  GLUCOSE 102* 154* 175*   Electrolytes  Recent Labs Lab Feb 11, 2015 0614 02/11/15 1916 2015-02-11 2200  CALCIUM 8.7* 8.9 8.2*  MG  --   --  2.0  PHOS  --   --  6.9*   Sepsis Markers  Recent Labs Lab February 11, 2015 2200  LATICACIDVEN 2.8*   ABG  Recent Labs Lab 02/11/2015 2010  PHART 7.370  PCO2ART 32.5*  PO2ART 297.0*   Liver Enzymes  Recent Labs Lab 01/09/15 2304 Feb 11, 2015 0614 11-Feb-2015 2200  AST 21 18 273*  ALT 20 16* 345*  ALKPHOS 45 52 72  BILITOT 0.8 0.8 1.3*  ALBUMIN 3.0* 2.7* 2.6*   Cardiac Enzymes  Recent Labs Lab 11-Feb-2015 1916 02/11/2015 2200 01/15/2015 0505  TROPONINI 2.88* 3.26* 4.51*   Glucose  Recent Labs Lab 01/12/15 2103 01/13/15 0048 01/13/15 0501 01/13/15 0754 01/13/15 1128 02/11/2015 1853  GLUCAP 145* 112* 135* 139* 105* 162*    Imaging Dg Chest Port 1 View  February 11, 2015   CLINICAL DATA:  Central line placement   EXAM: PORTABLE CHEST - 1 VIEW  COMPARISON:  12/16/2014  FINDINGS: New left IJ central line, tip at the SVC level. No pneumothorax or new mediastinal widening. There is no edema, consolidation, effusion, or pneumothorax. Calcified granuloma noted in the right upper lung. Normal heart size and mediastinal contours.  IMPRESSION: The new left IJ catheter is in good position.  No pneumothorax.   Electronically Signed   By: Marnee Spring M.D.   On: 2015/02/11 20:43     ASSESSMENT / PLAN:  PULMONARY A: Acute hypoxic respiratory failure - ABG reassuring, pt appears improved and does not currently require intubation High suspicion for PE OSA At risk aspiration following CVA P:   Continue supplemental O2 as needed to maintain SpO2 > 92%. Heparin gtt empirically. CTA chest once BP stabilized. NPO. Pulmonary hygiene. CXR intermittently.  NEUROLOGIC A:   Acute multifocal CVA following cardiac cath with DES placement 01/08/15 Syncope - presumed to do PE P:   Continue aspirin, plavix. Once stabilized, then back to CIR for continued rehab efforts. Pt to follow up with neurology as outpatient.  CARDIOVASCULAR CVL L IJ 5/5 >>> A:  Shock - presumed obstructive in setting of PE CAD s/p DES to RCA 01/08/15 Hx essential HTN, PVD, HLD P:  Levophed for goal MAP > 65. Check BNP, lactate. Trend troponins. Continue ASA, plavix. Monitor hemodynamics.  RENAL A:   Pseudohypocalcemia - corrects to 9.74 P:   Check ionized calcium. NS @ 125. BMP in AM.  GASTROINTESTINAL A:   Dysphagia following CVA - failed MBS initially; however, diet advanced to dysphagia 1 on 01/13/15 Hx esophageal stricture, fatty liver P:   NPO for now. If remains stable overnight, can consider restarting dysphagia 1 diet in AM.  HEMATOLOGIC A:   VTE Prophylaxis P:  SCD's / Heparin gtt. CBC in AM.  INFECTIOUS A:   No indication of infection P:   Monitor clinically.  ENDOCRINE A:   No known issues P:    Monitor glucose on BMP.   Family updated: Multiple family members updated by Dr. Vassie Loll.  Interdisciplinary Family Meeting v Palliative Care Meeting:  Due by: 01/21/15.   Rutherford Guys, PA - [facility] Pager: [phone]  or [phone] 01/31/2015, 7:42 AM

## 2015-02-11 NOTE — Discharge Summary (Signed)
NAMMikel Bowen:  Jerome Bowen, Jerome Bowen                ACCOUNT NO.:  [id number]  MEDICAL RECORD NO.:  [id number]  LOCATION:                               FACILITY:  [facility]  PHYSICIAN:  Jerome Bowen, M.D.DATE OF BIRTH:  1934/03/27  DATE OF ADMISSION:  01/14/2015 DATE OF DISCHARGE:  07-09-15                              DISCHARGE SUMMARY   DISCHARGE DIAGNOSES: 1. Functional deficits secondary to cardioembolic by cerebral     cerebrovascular accident after cardiac catheterization with     stenting. 2. Small right mid right peroneal vein deep vein thrombosis. 3. Dysphagia. 4. Hyperlipidemia. 5. Hypotension.  HISTORY OF PRESENT ILLNESS:  A 81 year old right-handed male with history of hypertension, hyperlipidemia who underwent elective cardiac catheterization for chest pain with abnormal stress test findings of a distal RCA stenosis underwent stenting January 08, 2015, at [facility].  Independent prior to admission [place] following the procedure noted to be agitated as well difficulty speaking.  MRI obtained showed multifocal infarcts in both anterior and posterior circulation consistent with cardiac embolus.  He was [facility] to [facility].  Developed postoperative hematoma after cardiac catheterization, did not receive tPA.  Echocardiogram with ejection fraction 70%.  Systolic function was vigorous.  MRI of the brain without stenosis.  Neurology consulted, maintained on aspirin and Plavix therapy.  Subcutaneous heparin for DVT prophylaxis.  Nothing by mouth, nasogastric tube.  He later passed a swallowing study, maintained on a dysphagia 1 nectar liquid.  Physical and occupational therapy ongoing.  The patient was admitted for a comprehensive rehab program.  PAST MEDICAL HISTORY:  See discharge diagnoses.  SOCIAL HISTORY:  Lives with spouse independent prior to admission.  FUNCTIONAL STATUS:  Upon admission to rehab  services was moderate assist to ambulate 50 feet with assistive device, moderate assist sit to stand, mod max assist activities of daily living.  PHYSICAL EXAMINATION:  VITAL SIGNS:  Blood pressure 135/59, pulse 80, temperature 97, respirations 22. GENERAL.  The patient was severely dysarthric, but intelligible.  He made eye contact with examiner.  Followed simple commands.  Limited vision to left hemisphere, had some difficulty in tracking, mild right facial droop. LUNGS:  Decreased breath sounds.  Clear to auscultation. CARDIAC:  Regular rate and rhythm. ABDOMEN:  Soft, nontender.  Good bowel sounds.  REHABILITATION HOSPITAL COURSE:  The patient was admitted to inpatient rehab services with therapies initiated on a 3-hour daily basis consisting of physical therapy, occupational therapy, speech therapy, and rehabilitation nursing.  The following issues were addressed during the patient's rehabilitation stay.  Pertaining to Mr. Jerome Bowen's cardioembolic's by cerebral CVA after cardiac catheterization, he remained on aspirin and Plavix therapy.  He had no chest pain or shortness of breath.  He was on subcutaneous heparin for DVT prophylaxis.  A venous Doppler study was completed Jan 15, 2015, that showed a small area of mid right peroneal vein DVT.  At this time, we continued to monitor followup Doppler study.  He would remain on aspirin and Plavix, as well as subcutaneous heparin.  No other bleeding episodes.  Noted on the morning of Jan 15, 2015, while up in the  bathroom with therapies attempting to strain to have a bowel movement, he became hypotensive.  He was alert, but lethargic.  He was placed back in bed, placed on intravenous fluids.  Morning chemistries were unremarkable. Oxygen saturations greater than 90%.  He was placed on 2 L of oxygen. His mental status continued to improve with IV fluids.  He was answering simple questions.  Cardiology Services was consulted with the  patient's history of cardiac catheterization and stenting for any further followup management.  Noted that evening of Jan 15, 2015, again while getting up to the side of the bed became cold, clammy, diaphoretic.  Respirations 32, blood pressure in the 60s.  Cardiology Services was on hand.  The patient transferred to ICU for ongoing monitoring.  All medication changes were made as per medical team.     Jerome Bowen, P.A.   ______________________________ Jerome Bowen, M.D.    DA/MEDQ  D:  79/05/16  T:  79/05/16  Job:  [id number]

## 2015-02-11 NOTE — Progress Notes (Signed)
Chaplain paged at 11:33pm to visit pt for prayer.   Chaplain arrived around 11:40pm. Pt and wife expressed that they did not make a request, perhaps misunderstood.   Both Pt and spouse welcomed the visit and asked for prayer. Noted that their pastor has been engaged with them concerning their circumstances.   Chaplain offered prayer and spiritual support.   Both pt and spouse grateful for the visit.   Gala RomneyBrown, Rebecka Oelkers J, Chaplain 01/31/2015

## 2015-02-11 NOTE — Procedures (Signed)
Intubation Procedure Note Jerome Bowen [id number] 04-16-1934  Procedure: Intubation Indications: Respiratory insufficiency  Procedure Details Consent: Unable to obtain consent because of emergent medical necessity. Time Out: Verified patient identification, verified procedure, site/side was marked, verified correct patient position, special equipment/implants available, medications/allergies/relevent history reviewed, required imaging and test results available.  Performed  Maximum sterile technique was used including gloves, hand hygiene and mask.  MAC    Evaluation Hemodynamic Status: BP stable throughout; O2 sats: stable throughout Patient's Current Condition: stable Complications: No apparent complications Patient did tolerate procedure well. Chest X-ray ordered to verify placement.  CXR: tube position acceptable.   Jerome Bowen,Jerome Bowen 01/17/2015

## 2015-02-11 NOTE — Progress Notes (Addendum)
Subjective:  Intubated sedate Earlier this AM, decrease in BP, Bicarb, acidosis.   Objective:  Vital Signs in the last 24 hours: Temp:  [97.1 F (36.2 C)-98.5 F (36.9 C)] 97.1 F (36.2 C) (05/06 0400) Pulse Rate:  [78-125] 125 (05/06 0600) Resp:  [16-35] 30 (05/06 0600) BP: (64-118)/(45-93) 113/62 mmHg (05/06 0600) SpO2:  [88 %-100 %] 99 % (05/06 0600) Arterial Line BP: (71-121)/(51-75) 110/71 mmHg (05/06 0600) Weight:  [179 lb 0.2 oz (81.2 kg)-180 lb 12.4 oz (82 kg)] 180 lb 12.4 oz (82 kg) (05/06 0500)  Intake/Output from previous day: 05/05 0701 - 05/06 0700 In: 421.9 [I.V.:421.9] Out: 235 [Urine:235]   Physical Exam: General: Well developed, ashen appearance, appears gravely ill on Vent. Head:  Normocephalic and atraumatic, ETT. Lungs: Vent noise B, rhochi Heart: Tachy reg S1 and S2.  No murmur, rubs or gallops.  Abdomen: soft, non-tender, positive bowel sounds. Extremities: No clubbing or cyanosis. No edema. Neurologic: sedate Skin - cool extremities GU - foley.    Lab Results:  Recent Labs  01/17/2015 1916 02/06/2015 2200  WBC 11.7* 14.9*  HGB 12.7* 12.0*  PLT 348 384    Recent Labs  01/29/2015 1916 02/05/2015 2200  NA 142 140  K 4.5 4.4  CL 109 111  CO2 16* 16*  GLUCOSE 154* 175*  BUN 33* 35*  CREATININE 1.66* 2.03*    Recent Labs  01/29/2015 2200 12/27/2014 0505  TROPONINI 3.26* 4.51*   Hepatic Function Panel  Recent Labs  02/02/2015 2200  PROT 5.6*  ALBUMIN 2.6*  AST 273*  ALT 345*  ALKPHOS 72  BILITOT 1.3*   No results for input(s): CHOL in the last 72 hours. No results for input(s): PROTIME in the last 72 hours.  Imaging: Dg Abd 1 View  01/30/2015   CLINICAL DATA:  Right-sided groin hematoma extending into the pelvic cavity. Hematoma, abnormal laboratory values  EXAM: ABDOMEN - 1 VIEW  COMPARISON:  CT scan of the abdomen and pelvis of January 08, 2015  FINDINGS: There is a moderate amount of gas within the visualized portions of the  stomach. The esophagogastric tube tip and proximal port appear lie in the proximal gastric body. There are loops of mildly distended gas-filled small bowel to the left of midline in the mid abdomen. There is small amount of gas in the right colon. There is contrast in the collapse descending colon and there is a moderate amount of stool and contrast within the rectum. There are degenerative changes of the lower lumbar spine.  IMPRESSION: Mild small bowel ileus.  No high-grade obstruction is demonstrated.   Electronically Signed   By: David  SwazilandJordan M.D.   On: Oct 05, 2014 08:43   Dg Chest Port 1 View  02/10/2015   CLINICAL DATA:  Hypoxia  EXAM: PORTABLE CHEST - 1 VIEW  COMPARISON:  Jan 15, 2015  FINDINGS: Endotracheal tube tip is 1.5 cm above the carina. Central catheter tip is in the superior vena cava. Nasogastric tube tip and side port are below the diaphragm. No pneumothorax. There is no edema or consolidation. Heart is normal in size with pulmonary vascular within normal limits. No adenopathy.  IMPRESSION: Tube and catheter positions as described without pneumothorax. No edema or consolidation.   Electronically Signed   By: Bretta BangWilliam  Woodruff III M.D.   On: Oct 05, 2014 08:41   Dg Chest Port 1 View  01/24/2015   CLINICAL DATA:  Central line placement  EXAM: PORTABLE CHEST - 1 VIEW  COMPARISON:  12/16/2014  FINDINGS: New left IJ central line, tip at the SVC level. No pneumothorax or new mediastinal widening. There is no edema, consolidation, effusion, or pneumothorax. Calcified granuloma noted in the right upper lung. Normal heart size and mediastinal contours.  IMPRESSION: The new left IJ catheter is in good position.  No pneumothorax.   Electronically Signed   By: Marnee SpringJonathon  Watts M.D.   On: 01/12/2015 20:43   Personally viewed.   Telemetry: Sinus tachy, no ST changes Personally viewed.   EKG:  Sinus tach, no ST elevation  Cardiac Studies:  ECHO P Bedside echo last evening, LV appeared normal EF, RV  appeared dilated with decreased systolic function. IVC was dilated.   Assessment/Plan:  Active Problems:   Acute respiratory failure   Pulmonary embolism   Cardiogenic shock  81 year old with CAD post RCA DES PCI [facility]/08/[facility] with CVA, superficial LE vein thrombosis, who was in rehab and developed hypotension/ shock, tachycardia requiring pressors, inotropic agent, with severe metabolic acidosis, acute kidney injury, elevated troponin, with differential that includes pulmonary embolism.  1) Shock  - norepi  - IVF  - ECHO formal pending  - 2+ femoral pulse in the setting of BP on arterial line 74 SBP.  - continue to work with CCM  2) CAD  - Normal LVEF  - DES to RCA earlier this week  - Continue ASA/Plavix  - ECG with no ST elevation (no evidence of acute stent thrombosis)  3) Metabolic acidosis  - contribution by hypoperfusion/ shock  - Bicarb IV push given  4) Acute kidney injury  - possible ATN secondary to shock  - supporting BP  5) Possible PE  - IV heparin  - to unstable to transport to CT scanner   - continue with empiric treatment  6) Elevated troponin  - likely secondary to shock  - no ECG evidence of acute stent thrombosis  Will continue to follow closely    Emilyann Banka 01/28/2015, 8:48 AM

## 2015-02-11 NOTE — Care Management Note (Signed)
Case Management Note  Patient Details  Name: Jerome DanasJack L Headrick MRN: [id number] Date of Birth: 08-Feb-1934  Subjective/Objective:       Adm from cir w resp distress, dvt, poss pul emb  , recent cva           Action/Plan: lives w wife   Expected Discharge Date:                  Expected Discharge Plan:  IP Rehab Facility  In-House Referral:     Discharge planning Services     Post Acute Care Choice:    Choice offered to:     DME Arranged:    DME Agency:     HH Arranged:    HH Agency:     Status of Service:     Medicare Important Message Given:    Date Medicare IM Given:    Medicare IM give by:    Date Additional Medicare IM Given:    Additional Medicare Important Message give by:     If discussed at Long Length of Stay Meetings, dates discussed:    Additional Comments:5/6 ur ins review  Hanley HaysDowell, Dev Dhondt T, RN 01/29/2015, 8:42 AM

## 2015-02-11 NOTE — Progress Notes (Signed)
eLink Physician-Brief Progress Note Patient Name: Tressia DanasJack L Deak DOB: 1934-03-09 MRN: [id number]   Date of Service  02/09/2015  HPI/Events of Note  shock  eICU Interventions  - NS bolus - set up CVP monitoring now - uptitrate norepi     Intervention Category Major Interventions: Shock - evaluation and management  Anijah Spohr S. 02/08/2015, 1:29 AM

## 2015-02-11 NOTE — Procedures (Signed)
CPR  Called by RN with patient in PEA, bradycardia followed by PEA.  ACLS protocol x5 minutes.  Airway established and patient had ROSC.  Please see code sheet for details.  Alyson ReedyWesam G. Yacoub, M.D. [facility]. Pager: [phone]. After hours pager: [phone].

## 2015-02-11 NOTE — Progress Notes (Signed)
PULMONARY / CRITICAL CARE MEDICINE   Name: Jerome Bowen Nordby MRN: [id number] DOB: Nov 20, 1933    ADMISSION DATE:  01/18/2015 CONSULTATION DATE:  01/14/2015  REFERRING MD :  CIR  CHIEF COMPLAINT:  Syncope, hypotension. Subsequent PEA arrest.  INITIAL PRESENTATION:  81 y.o. M who had DES [facility] to RCA [facility] 4/28 [facility].  Suffered acute CVA following procedure, [facility] 5/4.  5/5 had newly diagnosed DVT then went on to have syncope and hypotension.  Transferred to ICU and started on heparin for presumed PE (bedside echo by Dr. Anne FuSkains revealed dilated RV).  STUDIES:  MRI brain 4/29 >>> acute bilatereal infarcts of brainstem, occipital lobe, left parietal lobe, posterior right frontal lobe. LE Venous Doppler 5/5 >>> small are of the mid right peroneal veins appear to have DVT. Bedside echo 5/5 (Dr. Anne FuSkains) >>> dilated RV with decreased contractility.  IVC did not appear to have respiratory variation indicating elevated CVP.  There was no pericardial effusion and LVEF was normal (65-70%).  SIGNIFICANT EVENTS: 4/28 - DES placed to RCA 5/4 - [facility] 5/5 - new RLE DVT found, syncopal event, transferred to ICU and started on heparin gtt for presumed PE 5/6 PEA arrest  HISTORY OF PRESENT ILLNESS:  Pt is encephalopathic; therefore, this HPI is obtained from chart review. Jerome Bowen Abe is a 81 y.o. M with PMH as outlined below.  [facility] and had a DES placed to his RCA following an abnormal myoview.  Following procedure, pt noted to have aphasia and aguitation.  MRI of the brain revealed multifocal infarcts in both anterior and posterior circulation c/w cardiac embolus.  Pt also had post op hematoma of the right groin following cardiac cath (never received tPA). On 5/4, he was subsequently transferred to CIR at [facility] for continued rehab efforts.  On 5/5, he became orthostatic after getting up to use the restroom.  He was given an IVF bolus and responded  appropriately.  Rehab team saw pt and ordered LE venous dopplers for RLE swelling.  Venous studies returned preliminarily positive for a small area of DVT in the mid right peroneal veins. Later that evening, he was being helped to the bedside commode when he once again became hypotensive followed by a syncopal event.  Rapid response was called and pt was transferred to ICU. He was seen in consultation by Dr. Anne FuSkains of cardiology who performed a bedside echo which showed a dilated RV with decreased contractility.  IVC did not appear to have respiratory variation indicating elevated CVP.  There was no pericardial effusion and LVEF was normal (65-70%).  Due to known DVT, PE was considered most likely diagnosis.  Order for heparin infusion was placed and formal echo pending.  Once pt's BP stabilizes (hopefully in next hour or two), will consider CTA chest for further evaluation.  5/6 PEA arrest, intubated, on pressors.  SUBJECTIVE:   VITAL SIGNS: Temp:  [97.1 F (36.2 C)-98.5 F (36.9 C)] 97.1 F (36.2 C) (05/06 0400) Pulse Rate:  [78-125] 125 (05/06 0600) Resp:  [16-35] 30 (05/06 0600) BP: (64-118)/(45-93) 113/62 mmHg (05/06 0600) SpO2:  [88 %-100 %] 99 % (05/06 0600) Arterial Line BP: (71-121)/(51-75) 110/71 mmHg (05/06 0600) Weight:  [179 lb 0.2 oz (81.2 kg)-180 lb 12.4 oz (82 kg)] 180 lb 12.4 oz (82 kg) (05/06 0500) HEMODYNAMICS: CVP:  [14 mmHg] 14 mmHg VENTILATOR SETTINGS:   INTAKE / OUTPUT: Intake/Output  05/05 0701 - 05/06 0700 05/06 0701 - 05/07 0700   I.V. (mL/kg) 421.9 (5.1)    Total Intake(mL/kg) 421.9 (5.1)    Urine (mL/kg/hr) 235    Stool 0    Total Output 235     Net +186.9          Stool Occurrence 3 x      PHYSICAL EXAMINATION: General: Elderly male, chronically ill appearing, post arrest, pale. Neuro:Post arrest. Not following commands HEENT: [place]/AT. PERRL, sclerae anicteric, arcus senilis. Ott-> vent Cardiovascular:Tach 130, on levo Lungs: Coarse rhonchi  bilat Abdomen: BS x 4, soft, NT/ND. OGT in place Musculoskeletal: No gross deformities, mild RLE edema. R groin hematoma / ecchymosis. Skin: Intact, warm, no rashes.  LABS:  CBC  Recent Labs Lab 2015/02/02 0614 02-02-15 1916 02-02-2015 2200  WBC 6.3 11.7* 14.9*  HGB 11.7* 12.7* 12.0*  HCT 33.8* 36.8* 34.6*  PLT 234 348 384   Coag's  Recent Labs Lab 01/09/15 2304 2015-02-02 2200  APTT 31 183*  INR 1.18 1.39   BMET  Recent Labs Lab 2015-02-02 0614 Feb 02, 2015 1916 Feb 02, 2015 2200  NA 141 142 140  K 3.8 4.5 4.4  CL 108 109 111  CO2 25 16* 16*  BUN 21* 33* 35*  CREATININE 0.88 1.66* 2.03*  GLUCOSE 102* 154* 175*   Electrolytes  Recent Labs Lab 2015/02/02 0614 02-02-2015 1916 02/02/2015 2200  CALCIUM 8.7* 8.9 8.2*  MG  --   --  2.0  PHOS  --   --  6.9*   Sepsis Markers  Recent Labs Lab Feb 02, 2015 2200  LATICACIDVEN 2.8*   ABG  Recent Labs Lab 02/02/2015 2010  PHART 7.370  PCO2ART 32.5*  PO2ART 297.0*   Liver Enzymes  Recent Labs Lab 01/09/15 2304 Feb 02, 2015 0614 2015/02/02 2200  AST 21 18 273*  ALT 20 16* 345*  ALKPHOS 45 52 72  BILITOT 0.8 0.8 1.3*  ALBUMIN 3.0* 2.7* 2.6*   Cardiac Enzymes  Recent Labs Lab 2015/02/02 1916 Feb 02, 2015 2200 01/31/2015 0505  TROPONINI 2.88* 3.26* 4.51*   Glucose  Recent Labs Lab 01/12/15 2103 01/13/15 0048 01/13/15 0501 01/13/15 0754 01/13/15 1128 2015-02-02 1853  GLUCAP 145* 112* 135* 139* 105* 162*    Recent Labs Lab 02/02/2015 0614 02-02-15 1916 2015/02/02 2200  NA 141 142 140  K 3.8 4.5 4.4  CL 108 109 111  CO2 25 16* 16*  BUN 21* 33* 35*  CREATININE 0.88 1.66* 2.03*  GLUCOSE 102* 154* 175*    Recent Labs Lab 02/02/15 0614 2015/02/02 1916 02-Feb-2015 2200  HGB 11.7* 12.7* 12.0*  HCT 33.8* 36.8* 34.6*  WBC 6.3 11.7* 14.9*  PLT 234 348 384      Imaging Dg Chest Port 1 View  02-Feb-2015   CLINICAL DATA:  Central line placement  EXAM: PORTABLE CHEST - 1 VIEW  COMPARISON:  12/16/2014  FINDINGS: New left  IJ central line, tip at the SVC level. No pneumothorax or new mediastinal widening. There is no edema, consolidation, effusion, or pneumothorax. Calcified granuloma noted in the right upper lung. Normal heart size and mediastinal contours.  IMPRESSION: The new left IJ catheter is in good position.  No pneumothorax.   Electronically Signed   By: Marnee Spring M.D.   On: 02-02-2015 20:43   ASSESSMENT / PLAN:  PULMONARY 5/6 OTT>> A: Acute hypoxic respiratory failure, intubated 0745 5/6 during PEA arrest High suspicion for PE OSA  P:   Vent bundle Check abg and adjust vent as needed Heparin gtt empirically.  NEUROLOGIC A:   Acute multifocal CVA following cardiac cath with DES placement 01/08/15 Syncope - presumed to do PE P:   Continue aspirin, plavix. Pt to follow up with neurology as outpatient.  CARDIOVASCULAR CVL Bowen IJ 5/5 >>> 5/6 rt groin hematoma from recent CC A:  Shock - presumed obstructive in setting of PE CAD s/p DES to RCA 01/08/15 Hx essential HTN, PVD, HLD PEA arrest 5/6 Not sure if hypotension is due to PE vs acidosis induced hypokinesis. P:  Levophed for goal MAP > 65. Follow labs. Trend troponins. Rising and I expect will increase post arrest but already on heparin for PE.? Cards consult when stable. Check 12 lead Will order an echo for RV function. Continue ASA, plavix. Monitor hemodynamics.  RENAL Lab Results  Component Value Date   CREATININE 2.03* Sep 27, 2014   CREATININE 1.66* Sep 27, 2014   CREATININE 0.88 Sep 27, 2014   CREATININE 0.96 01/09/2015   CREATININE 1.03 01/08/2015   CREATININE 1.03 12/16/2014   A:   Renal insuff Pseudohypocalcemia - corrects to 9.74 P:   Check ionized calcium. NS @ 125. BMP daily Avoid diuretics for now Bicarb drip  If acidosis persists may need renal for CVVH.  GASTROINTESTINAL A:   Dysphagia following CVA - failed MBS initially; however, diet advanced to dysphagia 1 on 01/13/15 Hx esophageal stricture, fatty  liver Stool hemoccult positive but not gross bleeding. P:   NPO for now. Place OGT now that he is intubated Nutrition consulted 5/6 for tube feeds Monitor H/H.  HEMATOLOGIC A:   VTE Prophylaxis P:  SCD's / Heparin gtt. CBC in AM.  INFECTIOUS A:   No indication of infection P:   Monitor clinically.  ENDOCRINE A:   No known issues P:   Monitor glucose on BMP.  Family updated: Wife updated  Interdisciplinary Family Meeting v Palliative Care Meeting:  Due by: 01/21/15.  Discussion: Multiple organs failing, new renal failure added to resp failure and proving refractory to all interventions. Consider limiting further interventions as they will most likely prove to be futile. Discussed this with wife and she wants a LCB , no shock or compressions. Confirmed with Dr. Molli KnockYacoub as to LCB.  Brett CanalesSteve Minor ACNP [facility] Pager [phone] till 3 pm If no answer page [phone]  Above note updated in full.  I saw patient during and after arrest.  Labored respiration noted.  Patient was intubated by me.  D-dimer is positive but patient is having a GI bleed as well precluding lytic therapy.  I would not give lytics based on d-dimer alone anyway.  I suspect that shock is cardiogenic at this point (pH of 7.0).  Monitor H&H.  If frank blood will call GI.  If repeat ABG post intubation remains poor then will consider renal consultation and CVVH.  The patient is critically ill with multiple organ systems failure and requires high complexity decision making for assessment and support, frequent evaluation and titration of therapies, application of advanced monitoring technologies and extensive interpretation of multiple databases.   Critical Care Time devoted to patient care services described in this note is  35  Minutes. This time reflects time of care of this signee Dr Koren BoundWesam Damoni Causby. This critical care time does not reflect procedure time, or teaching time or supervisory time of PA/NP/Med student/Med  Resident etc but could involve care discussion time.  Alyson ReedyWesam G. Camilla Skeen, M.D. [facility] Pulmonary/Critical Care Medicine. Pager: [phone]. After hours pager: [phone].  01/14/2015, 8:07 AM

## 2015-02-11 NOTE — Progress Notes (Signed)
ANTICOAGULATION CONSULT NOTE - Follow Up Consult  Pharmacy Consult for heparin Indication: DVT w/ possible PE in setting of recent CVA   Labs:  Recent Labs  14-May-2015 0614 14-May-2015 1916 14-May-2015 2200 02/04/2015 0505  HGB 11.7* 12.7* 12.0*  --   HCT 33.8* 36.8* 34.6*  --   PLT 234 348 384  --   APTT  --   --  183*  --   LABPROT  --   --  17.2*  --   INR  --   --  1.39  --   HEPARINUNFRC  --   --   --  0.62  CREATININE 0.88 1.66* 2.03*  --   TROPONINI  --  2.88* 3.26*  --     Assessment: 81yo male supratherapeutic on heparin with initial dosing for DVT/?PE w/ recent CVA.  Goal of Therapy:  Heparin level 0.3-0.5 units/ml   Plan:  Will decrease heparin gtt slightly to 1000 units/hr and check level in 8hr.  Jerome GamblesVeronda Jeran Bowen, PharmD, BCPS  01/31/2015,6:19 AM

## 2015-02-11 NOTE — Discharge Summary (Deleted)
NAME:  Storer, Andi                ACCOUNT NO.:  [id number]  MEDICAL RECORD NO.:  [id number]  LOCATION:                               FACILITY:  [facility]  PHYSICIAN:  Andrew E. Kirsteins, M.D.DATE OF BIRTH:  07/29/1934  DATE OF ADMISSION:  01/14/2015 DATE OF DISCHARGE:  01/30/2015                              DISCHARGE SUMMARY   DISCHARGE DIAGNOSES: 1. Functional deficits secondary to cardioembolic by cerebral     cerebrovascular accident after cardiac catheterization with     stenting. 2. Small right mid right peroneal vein deep vein thrombosis. 3. Dysphagia. 4. Hyperlipidemia. 5. Hypotension.  HISTORY OF PRESENT ILLNESS:  A 81-year-old right-handed male with history of hypertension, hyperlipidemia who underwent elective cardiac catheterization for chest pain with abnormal stress test findings of a distal RCA stenosis underwent stenting January 08, 2015, at [facility].  Independent prior to admission living with his wife in [place], [place] following the procedure noted to be agitated as well difficulty speaking.  MRI obtained showed multifocal infarcts in both anterior and posterior circulation consistent with cardiac embolus.  He was transferred to [facility].  Developed postoperative hematoma after cardiac catheterization, did not receive tPA.  Echocardiogram with ejection fraction 70%.  Systolic function was vigorous.  MRI of the brain without stenosis.  Neurology consulted, maintained on aspirin and Plavix therapy.  Subcutaneous heparin for DVT prophylaxis.  Nothing by mouth, nasogastric tube.  He later passed a swallowing study, maintained on a dysphagia 1 nectar liquid.  Physical and occupational therapy ongoing.  The patient was admitted for a comprehensive rehab program.  PAST MEDICAL HISTORY:  See discharge diagnoses.  SOCIAL HISTORY:  Lives with spouse independent prior to admission.  FUNCTIONAL STATUS:  Upon admission to rehab  services was moderate assist to ambulate 50 feet with assistive device, moderate assist sit to stand, mod max assist activities of daily living.  PHYSICAL EXAMINATION:  VITAL SIGNS:  Blood pressure 135/59, pulse 80, temperature 97, respirations 22. GENERAL.  The patient was severely dysarthric, but intelligible.  He made eye contact with examiner.  Followed simple commands.  Limited vision to left hemisphere, had some difficulty in tracking, mild right facial droop. LUNGS:  Decreased breath sounds.  Clear to auscultation. CARDIAC:  Regular rate and rhythm. ABDOMEN:  Soft, nontender.  Good bowel sounds.  REHABILITATION HOSPITAL COURSE:  The patient was admitted to inpatient rehab services with therapies initiated on a 3-hour daily basis consisting of physical therapy, occupational therapy, speech therapy, and rehabilitation nursing.  The following issues were addressed during the patient's rehabilitation stay.  Pertaining to Mr. Gladman's cardioembolic's by cerebral CVA after cardiac catheterization, he remained on aspirin and Plavix therapy.  He had no chest pain or shortness of breath.  He was on subcutaneous heparin for DVT prophylaxis.  A venous Doppler study was completed Jan 15, 2015, that showed a small area of mid right peroneal vein DVT.  At this time, we continued to monitor followup Doppler study.  He would remain on aspirin and Plavix, as well as subcutaneous heparin.  No other bleeding episodes.  Noted on the morning of Jan 15, 2015, while up in the   bathroom with therapies attempting to strain to have a bowel movement, he became hypotensive.  He was alert, but lethargic.  He was placed back in bed, placed on intravenous fluids.  Morning chemistries were unremarkable. Oxygen saturations greater than 90%.  He was placed on 2 L of oxygen. His mental status continued to improve with IV fluids.  He was answering simple questions.  Cardiology Services was consulted with the  patient's history of cardiac catheterization and stenting for any further followup management.  Noted that evening of Jan 15, 2015, again while getting up to the side of the bed became cold, clammy, diaphoretic.  Respirations 32, blood pressure in the 60s.  Cardiology Services was on hand.  The patient transferred to ICU for ongoing monitoring.  All medication changes were made as per medical team.     Mariam Dollaraniel Jacquelin Krajewski, P.A.   ______________________________ Erick ColaceAndrew E. Kirsteins, M.D.    DA/MEDQ  D:  11/15/14  T:  11/15/14  Job:  [id number]

## 2015-02-11 NOTE — Discharge Summary (Signed)
Discharge summary job [id number]

## 2015-02-11 NOTE — Progress Notes (Signed)
No heart sounds or breath sounds auscultated for one minute with Marijo FileStefani Prevette, RN Time of death 15:45

## 2015-02-11 NOTE — Progress Notes (Signed)
Called by bedside RN numerous times over the phone and has been seen multiple times throughout the day.  AM ABG noted with pH 6.883 with mixed acidosis.  RR increased and bicarb drip started.  Patient's ABG was repeated multiple times throughout the day with failure to improve.  Spoke with wife and made patient a LCB with no CPR and no cardioversion.  A few hours after, patient's BP dropped again and epi and bicarb where given with some improvement of BP however started to deteriorating again.  The patient has a GI bleed and enlarging groin hematoma.  PE diagnosis is not a sure diagnosis.  Patient also had recent stroke <14 days.  There are multiple contraindications for lysis at this point.  I spoke with wife, explained that lysis is a very risky option.  Lysing now can result in death from bleeding, not lysing can result in death from heart failure.  After discussion, decision was made that we will make patient a full DNR and give 10 mg of morphine for comfort.  Will increase RR and bicarb drip.  If effective will continue support, if not then will allow for medications to ware off and for BP to deteriorate til asystole.  Wife understood and asked that we only keep him comfortable.  The patient is critically ill with multiple organ systems failure and requires high complexity decision making for assessment and support, frequent evaluation and titration of therapies, application of advanced monitoring technologies and extensive interpretation of multiple databases.   Critical Care Time devoted to patient care services described in this note is  60  Minutes. This time reflects time of care of this signee Dr Koren BoundWesam Jatoria Kneeland. This critical care time does not reflect procedure time, or teaching time or supervisory time of PA/NP/Med student/Med Resident etc but could involve care discussion time.  Alyson ReedyWesam G. Danicia Terhaar, M.D. [facility] Pulmonary/Critical Care Medicine. Pager: [phone]. After hours pager: [phone].

## 2015-02-11 NOTE — Progress Notes (Addendum)
Initial Nutrition Assessment  DOCUMENTATION CODES:  Not applicable  INTERVENTION:  Tube feeding  NUTRITION DIAGNOSIS:  Inadequate oral intake related to inability to eat as evidenced by NPO status.    GOAL:  Patient will meet greater than or equal to 90% of their needs    MONITOR:  Vent status, TF tolerance, Labs  REASON FOR ASSESSMENT:  Consult Enteral/tube feeding initiation and management  ASSESSMENT:  Pt who had DES placed to [facility]CA [facility] 4/[facility] and then had a CVA following procedure. [facility]. [facility] and started on heparin for presumed PE. Pt had PEA arrest 5/6 and now intubated.   Patient is currently intubated on ventilator support MV: 19.2 L/min Temp (24hrs), Avg:97.3 F (36.3 C), Min:97.1 F (36.2 C), Max:97.7 F (36.5 C)  MAP up to 75 Abd xray showed mild small bowel ileus   Unable to complete Nutrition-Focused physical exam at this time.   Height:  Ht Readings from Last 1 Encounters:  01/09/15 5\' 8"  (1.727 m)    Weight:  Wt Readings from Last 1 Encounters:  06/02/15 180 lb 12.4 oz (82 kg)    Ideal Body Weight:  70 kg  Wt Readings from Last 10 Encounters:  06/02/15 180 lb 12.4 oz (82 kg)  01/14/15 177 lb (80.287 kg)  01/09/15 179 lb 14.4 oz (81.602 kg)    BMI:  Body mass index is 27.49 kg/(m^2).  Estimated Nutritional Needs:  Kcal:  2000  Protein:  115-125  Fluid:  >/= 2 L/day  Skin:  Reviewed, no issues  Diet Order:  Diet NPO time specified  EDUCATION NEEDS:  No education needs identified at this time   Intake/Output Summary (Last 24 hours) at 06/02/15 1452 Last data filed at 06/02/15 1300  Gross per 24 hour  Intake 2417.1 ml  Output    295 ml  Net 2122.1 ml    Last BM:  5/6  Kendell BaneHeather Johanna Stafford RD, LDN, CNSC [phone] Pager [phone] After Hours Pager

## 2015-02-11 NOTE — Progress Notes (Signed)
   November 14, 2014 1500  Clinical Encounter Type  Visited With Patient and family together;Health care provider;Other (Comment) (Patient's pastor)  Visit Type Initial;Spiritual support;Social support;Critical Care;Patient actively dying  Referral From Nurse  Spiritual Encounters  Spiritual Needs Emotional;Grief support  Stress Factors  Family Stress Factors Loss   Chaplain was paged to patient's room at 2:48 PM. Chaplain was notified that the patient was declining and that the family was having a difficult time. When chaplain arrived, multiple family members were in the patient's room and the medical team was working with the patient. Patient spoke to a man in the room who happened to be the family's local pastor. Patient's pastor let me know that he had prayed with the family. Patient's pastor explained that the family is having a hard time because earlier in the week the patient was coherent and talking and able to spend time with him and the family. Chaplain made sure medical team was aware of the pastor's presence and made pastor aware that chaplain support is available if he gets overwhelmed. Family is continuing to grieve at the bedside as the patient continues to decline but they are well supported at this time. Page Merrilyn Puman-Call chaplain if further support needed.  Cranston NeighborStrother, Kyesha Balla R, Chaplain  3:33 PM

## 2015-02-11 DEATH — deceased

## 2015-02-20 NOTE — Discharge Summary (Signed)
Jerome Bowen, Jerome Bowen NO.:  [id number]  MEDICAL RECORD NO.:  [id number]  LOCATION:  2H07C                        FACILITY:  [facility]  PHYSICIAN:  Felipa Evener, MD  DATE OF BIRTH:  23-Sep-1933  DATE OF ADMISSION:  01/19/2015 DATE OF DISCHARGE:  25-Jan-2015                              DISCHARGE SUMMARY   DEATH SUMMARY  PRIMARY DIAGNOSIS/CAUSE OF DEATH:  Pulseless electric activity, cardiac arrest.  SECONDARY DIAGNOSIS:  Acute respiratory failure, syncope, cardiogenic shock, __________diffuse multifocal cerebrovascular accidents, acute renal insufficiency, dysphagia from cerebrovascular accident.  The patient is a 81 year old male with extensive past medical history presented to [facility], presented there after having a multifocal CVAs after placement of drug-eluting stent in the right MCA in [facility].  The patient was transferred to rehab, however, had a syncopal episode in rehab and was found to be hypotensive, was presumed due to PE, but that was never confirmed due to instability__________.  The patient was admitted to the intensive care unit post intubation.  The patient had multiple contraindications for __________.  At this point, __________ was established.  The patient remained very hypotensive, but again had a GI bleed during the admission with enlarged groin hematomas post cardiac catheterization.  So, __________ were absolutely contraindicated.  I spoke with the wife, explained the __________ are risky option __________ result in death from bleeding __________ result in death from heart failure.  After discussion, decision was made to make the patient full DNR and give 10 mg of morphine for comfort.  Decreased the respiratory rate on the ventilator and bicarb drip, that continue further support, essentially the patient would be __________ DNR comfort care that was established, and the patient expired shortly  thereafter.  The family was at beside.     Felipa Evener, MD     WJY/MEDQ  D:  02/20/2015  T:  02/20/2015  Job:  [id number]

## 2016-12-04 IMAGING — CR DG CHEST 2V
1 series · 2 of 2 positions shown · non-contrast
Comparison: None.

CLINICAL DATA: Mid chest pain since noon today.

EXAM:
CHEST  2 VIEW

[Series 1: w chest pa · 0.14mm/px · 2 of 2 slices shown]
[im 1/2]
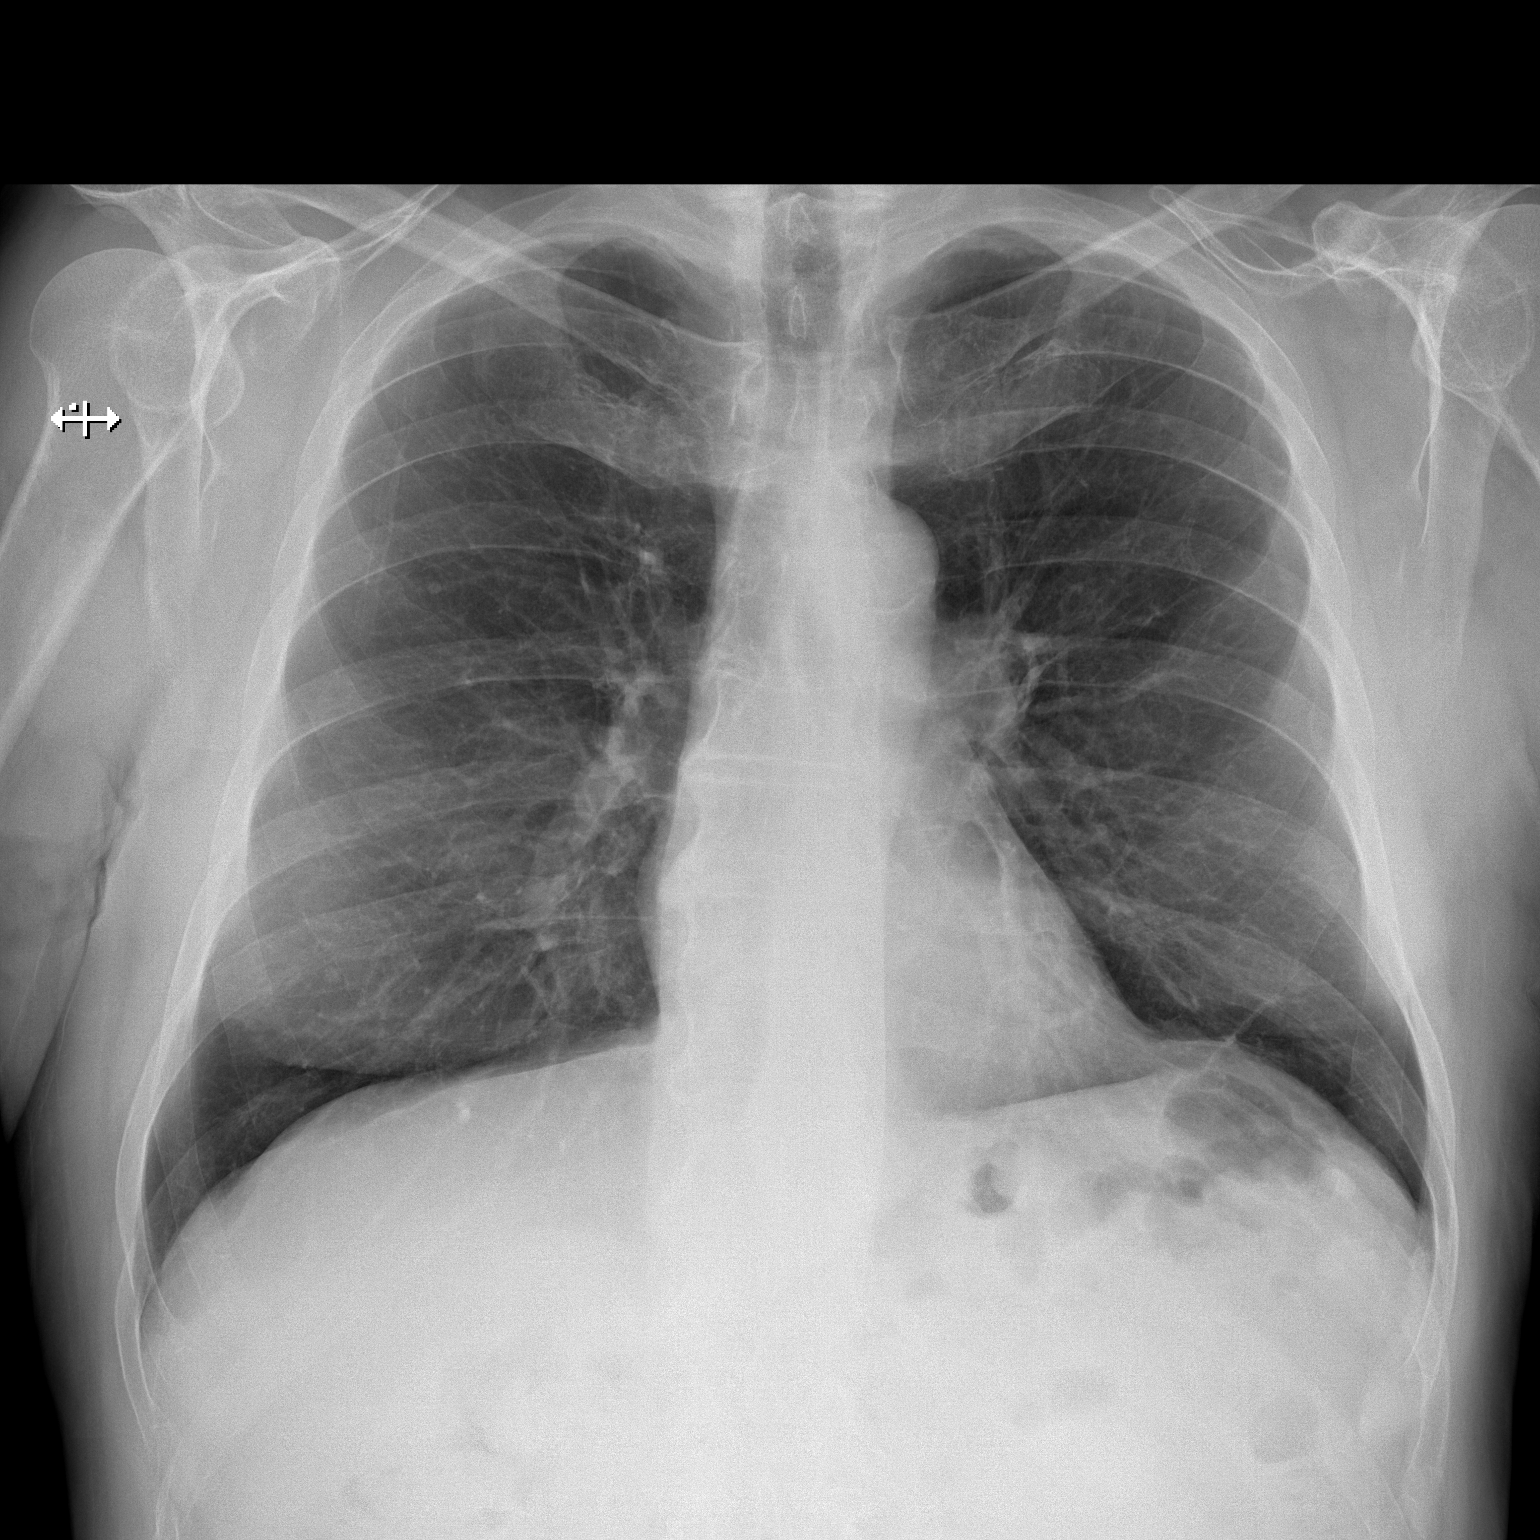
[im 2/2]
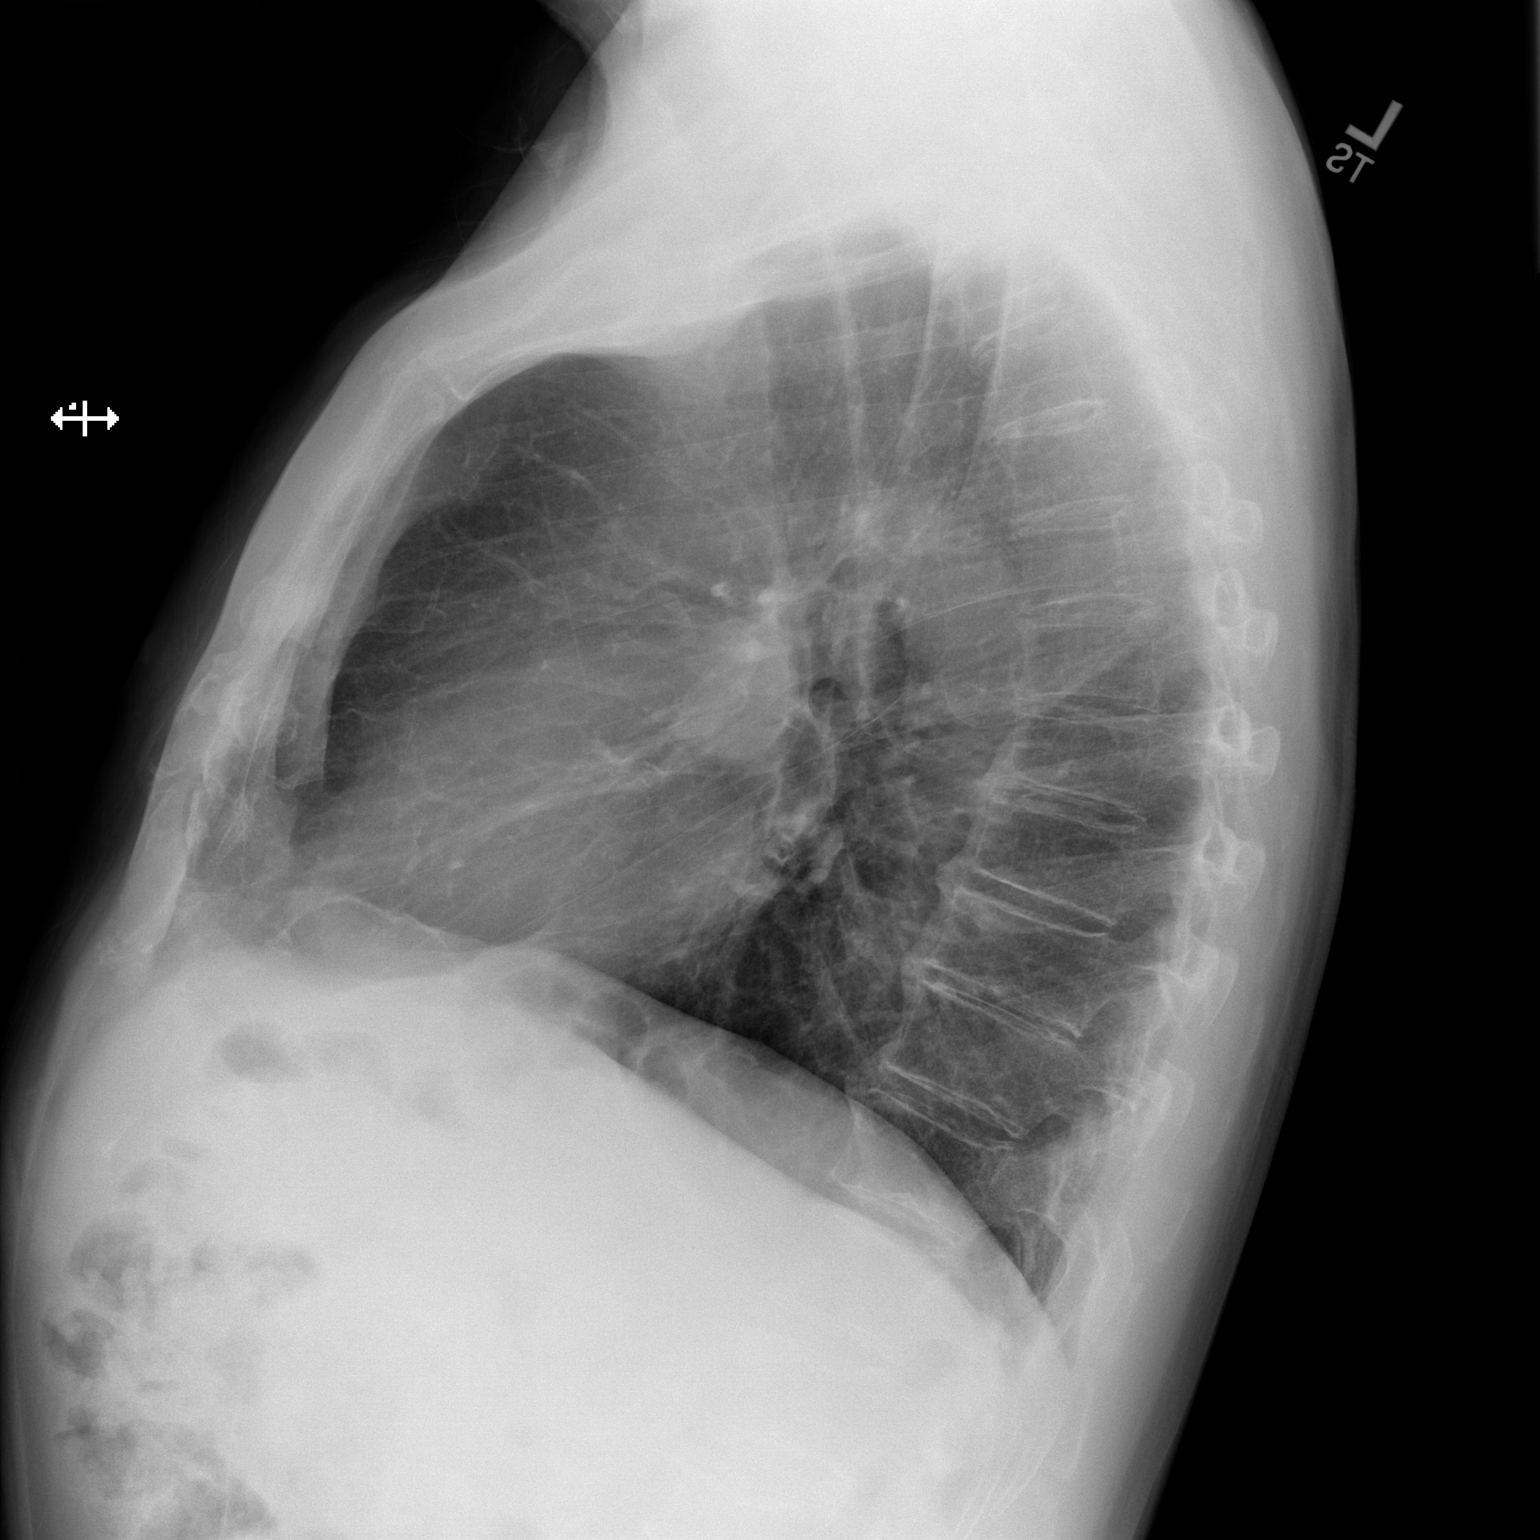

[2 of 2 positions shown; findings below may reference images not displayed]

FINDINGS: Mild hyperinflation. Normal heart size and pulmonary vascularity. No
focal airspace disease or consolidation in the lungs. No blunting of
costophrenic angles. No pneumothorax. Mediastinal contours appear
intact. Calcification of the aorta. Degenerative changes in the
spine.
IMPRESSION: Hyperinflation may indicate emphysema. No evidence of active
pulmonary disease.

## 2016-12-07 IMAGING — CT CT ANGIO CHEST
2 of 6 series · 18 of 36 positions shown · IV contrast (omnipaque)
Comparison: Chest radiographs 12/16/2014.

CLINICAL DATA: 81-year-old male with acute chest pain since this
morning. Initial encounter.

EXAM:
CT ANGIOGRAPHY CHEST WITH CONTRAST
TECHNIQUE: Multidetector CT imaging of the chest was performed using the
standard protocol during bolus administration of intravenous
contrast. Multiplanar CT image reconstructions and MIPs were
obtained to evaluate the vascular anatomy.
CONTRAST:  100 mL Omnipaque 350.

[Series 5: pe 1.0 thins · axial · 0.78mm/px · z∈[-795,-505]mm · 17 of 328 slices shown]
[im 19/328  lung]
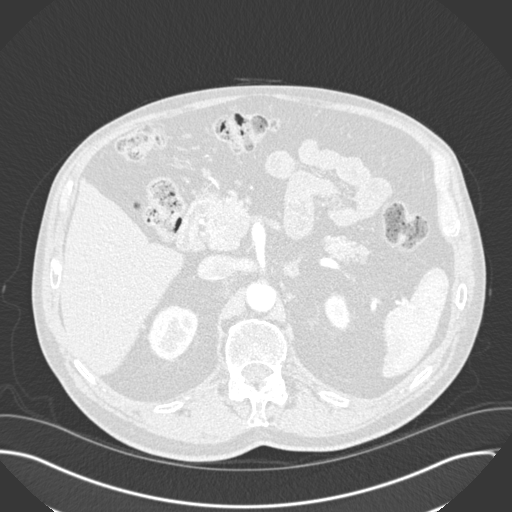
[im 37/328  mediastinal]
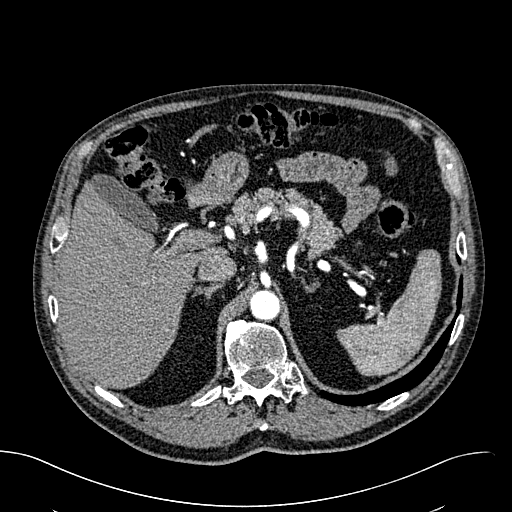
[im 55/328  lung]
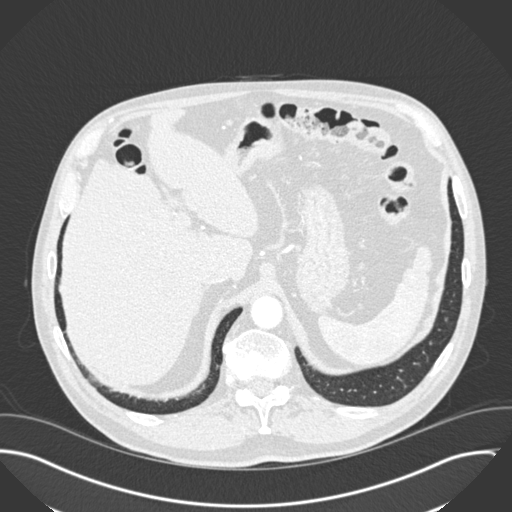
[im 73/328  mediastinal]
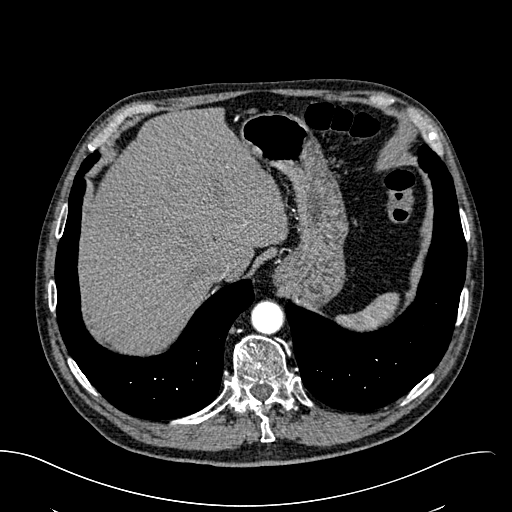
[im 91/328  lung]
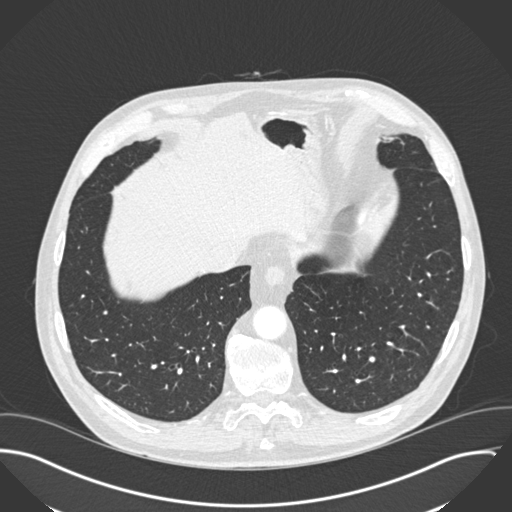
[im 110/328  mediastinal]
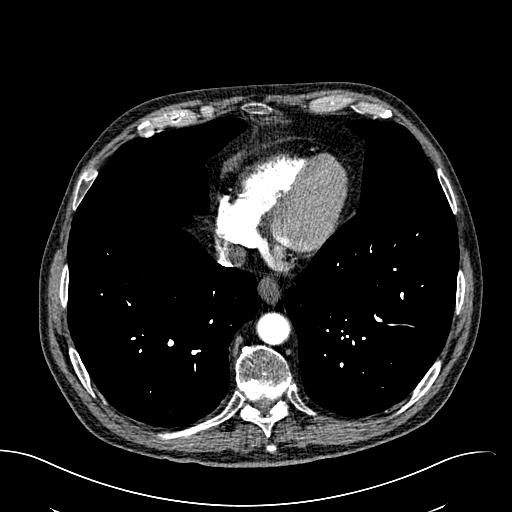
[im 128/328  lung]
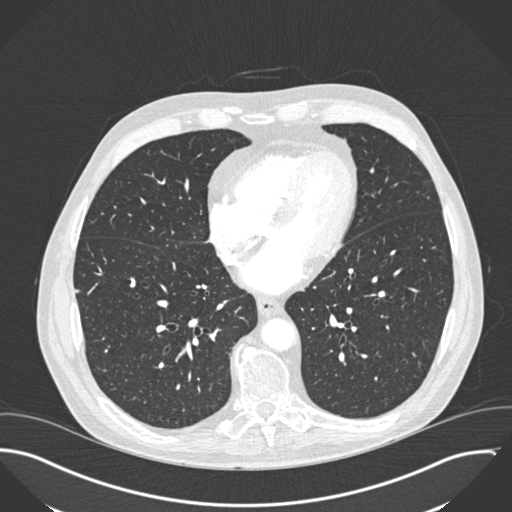
[im 146/328  mediastinal]
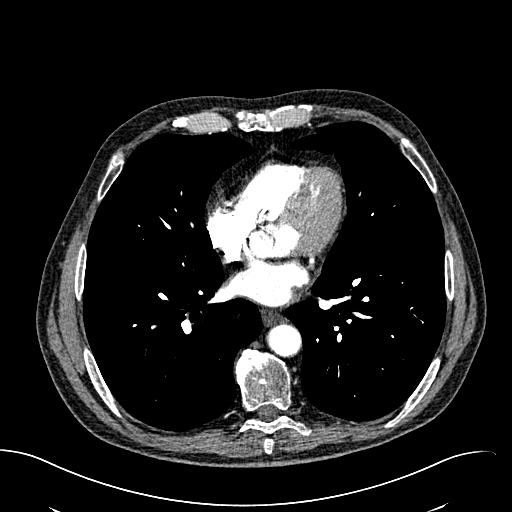
[im 164/328  lung]
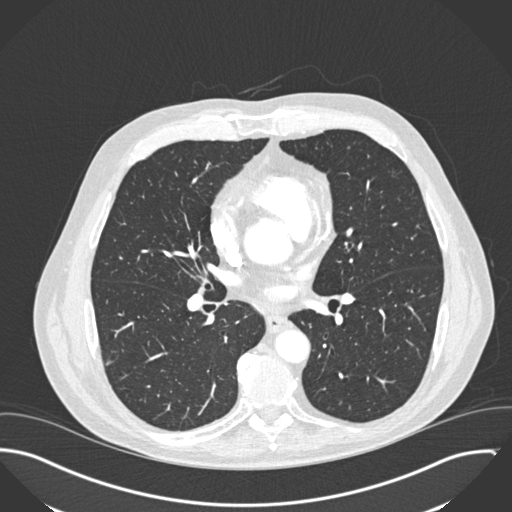
[im 182/328  mediastinal]
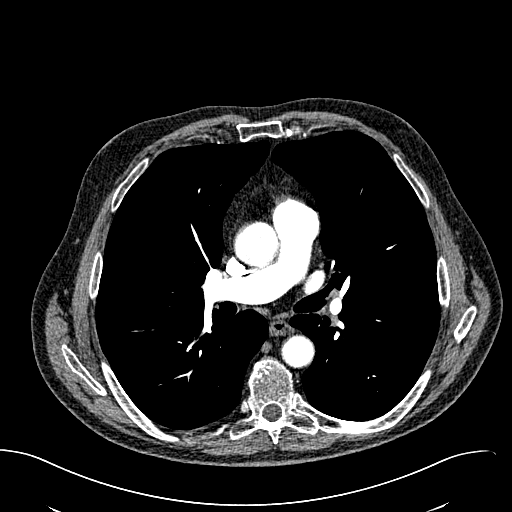
[im 200/328  lung]
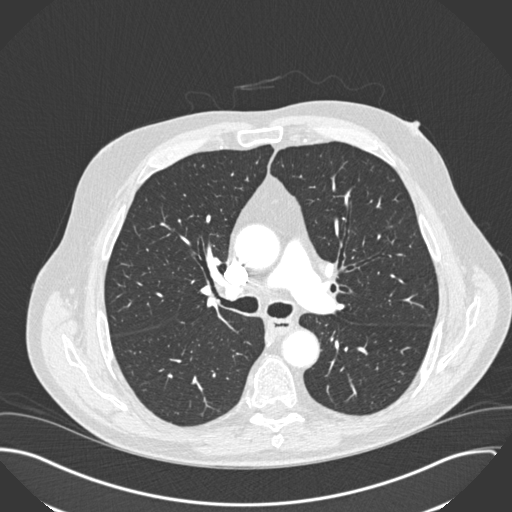
[im 219/328  mediastinal]
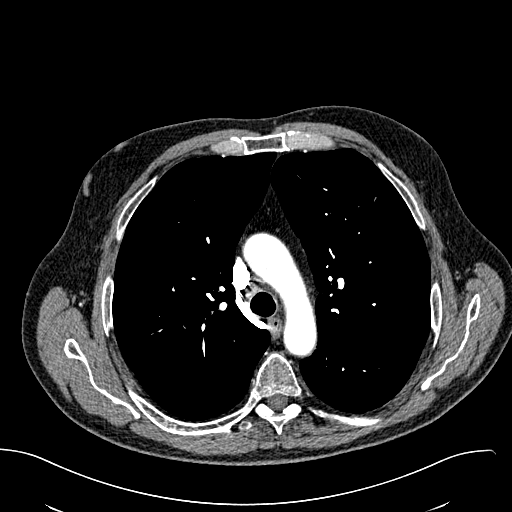
[im 237/328  lung]
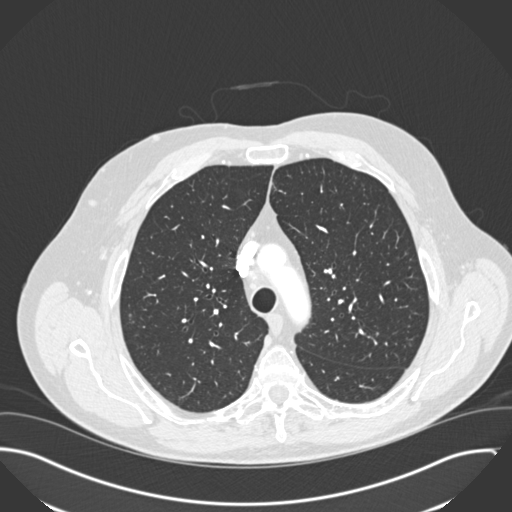
[im 255/328  mediastinal]
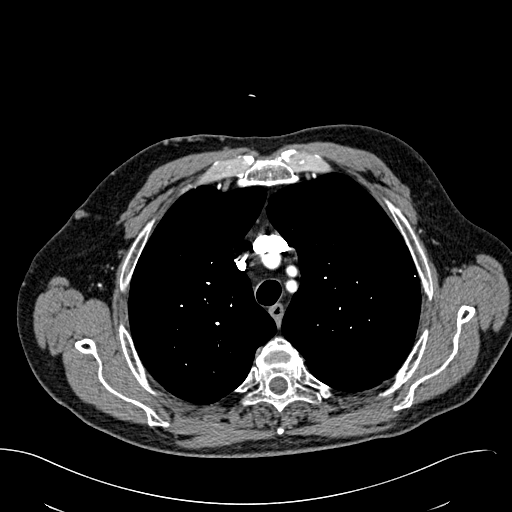
[im 273/328  lung]
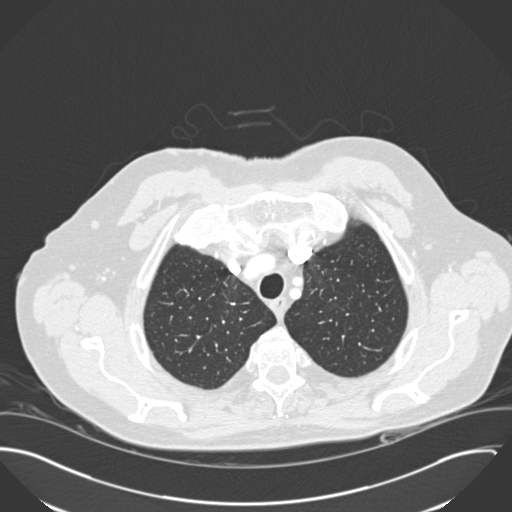
[im 291/328  mediastinal]
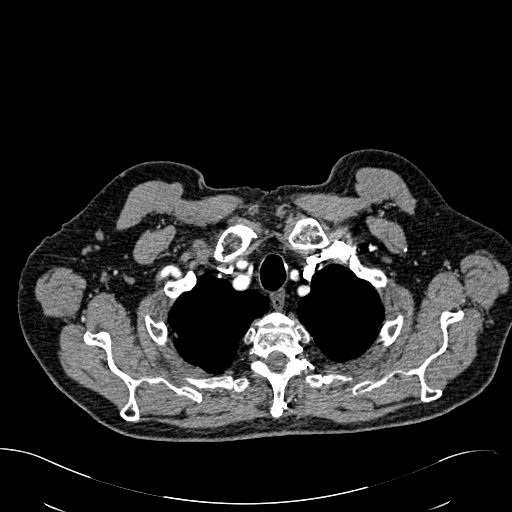
[im 309/328  lung]
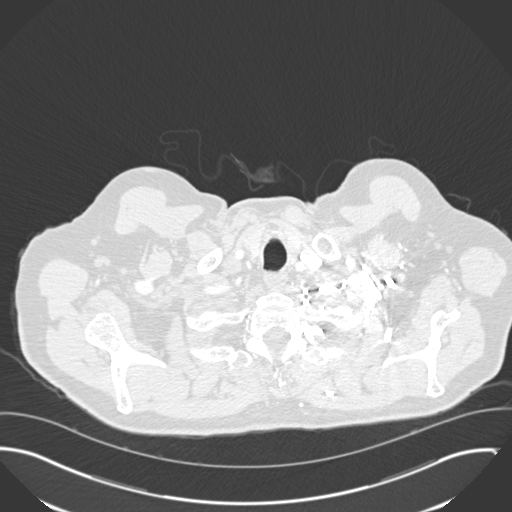

[Series 7: cor pe 2.0 mpr · coronal · 0.66mm/px · 1 of 151 slices shown]
[im 76/151  mediastinal]
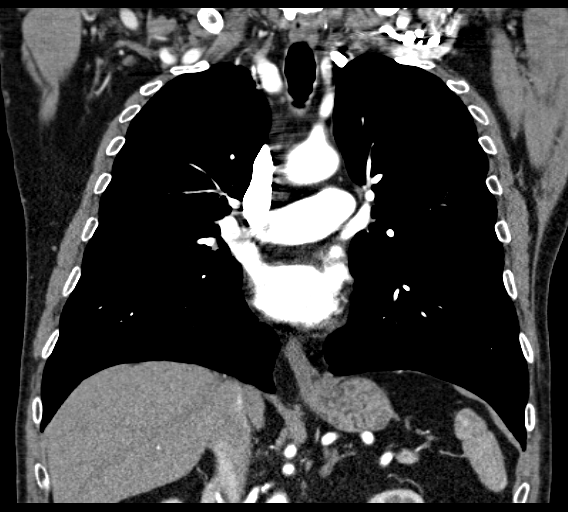

[18 of 36 positions shown; findings below may reference images not displayed]

FINDINGS: Good contrast bolus timing in the pulmonary arterial tree.

No focal filling defect identified in the pulmonary arterial tree to
suggest the presence of acute pulmonary embolism.

Large lung volumes. Major airways are patent. There is mild to
moderate bilateral bronchiectasis, which tapers from the hila. Small
calcified granuloma in the right lower lobe on series 6, image 99
several small calcified granulomas in the right lower lobe. There is
a non calcified subpleural right lower lobe nodule on series 6,
image 84 measuring 7 x 3 mm. Small subpleural calcified granulomas
in the left upper lobe.

No pleural effusion. No pericardial effusion. Calcified
atherosclerosis of the aorta and coronary arteries. No mediastinal
or hilar lymphadenopathy.

Negative thoracic inlet. No axillary lymphadenopathy. There are
right chest wall venous collaterals of uncertain significance.

Small fat containing hiatal hernia. Negative visible liver,
gallbladder, spleen, pancreas, adrenal glands, kidneys, and other
bowel loops in the upper abdomen.

Occasional chronic rib fractures. Osteopenia. No acute osseous
abnormality identified.

Review of the MIP images confirms the above findings.
IMPRESSION: 1.  No evidence of acute pulmonary embolus.
2. Perihilar bronchiectasis. Hyperinflation. 5 mm noncalcified right
lower lobe lung nodule on image 84. Follow-up chest CT at 6-12
months is recommended.
This recommendation follows the consensus statement: Guidelines for
Management of Small Pulmonary Nodules Detected on CT Scans: A
Statement from the [HOSPITAL] as published in Radiology
9666;[DATE].
3. Aortic and coronary artery calcified atherosclerosis.
These results will be called to the ordering clinician or
representative by the [HOSPITAL] at the imaging location.
